# Patient Record
Sex: Female | Born: 1980
Health system: Southern US, Community
[De-identification: ages and names within clinical notes are randomized; demographics above are authoritative.]

## PROBLEM LIST (undated history)

## (undated) ENCOUNTER — Inpatient Hospital Stay (HOSPITAL_COMMUNITY): Payer: Self-pay

## (undated) DIAGNOSIS — K859 Acute pancreatitis without necrosis or infection, unspecified: Secondary | ICD-10-CM

## (undated) DIAGNOSIS — O1492 Unspecified pre-eclampsia, second trimester: Secondary | ICD-10-CM

## (undated) DIAGNOSIS — R002 Palpitations: Secondary | ICD-10-CM

---

## 2011-06-14 ENCOUNTER — Encounter (HOSPITAL_BASED_OUTPATIENT_CLINIC_OR_DEPARTMENT_OTHER): Payer: Self-pay | Admitting: *Deleted

## 2011-06-14 ENCOUNTER — Emergency Department (HOSPITAL_BASED_OUTPATIENT_CLINIC_OR_DEPARTMENT_OTHER)
Admission: EM | Admit: 2011-06-14 | Discharge: 2011-06-14 | Disposition: A | Discharge status: Home / Self Care | Payer: Self-pay | Attending: Emergency Medicine | Admitting: Emergency Medicine

## 2011-06-14 DIAGNOSIS — F17200 Nicotine dependence, unspecified, uncomplicated: Secondary | ICD-10-CM | POA: Insufficient documentation

## 2011-06-14 DIAGNOSIS — J111 Influenza due to unidentified influenza virus with other respiratory manifestations: Secondary | ICD-10-CM | POA: Insufficient documentation

## 2011-06-14 DIAGNOSIS — R0602 Shortness of breath: Secondary | ICD-10-CM | POA: Insufficient documentation

## 2011-06-14 DIAGNOSIS — O98919 Unspecified maternal infectious and parasitic disease complicating pregnancy, unspecified trimester: Secondary | ICD-10-CM

## 2011-06-14 DIAGNOSIS — IMO0002 Reserved for concepts with insufficient information to code with codable children: Secondary | ICD-10-CM | POA: Insufficient documentation

## 2011-06-14 LAB — PREGNANCY, URINE: Preg Test, Ur: POSITIVE

## 2011-06-14 LAB — GLUCOSE, CAPILLARY: Glucose-Capillary: 94 mg/dL (ref 70–99)

## 2011-06-14 MED ORDER — ONDANSETRON HCL 4 MG PO TABS: 4.0000 mg | ORAL_TABLET | Freq: Four times a day (QID) | ORAL | Status: AC

## 2011-06-14 MED ORDER — PRENATAL RX 60-1 MG PO TABS: 1.0000 | ORAL_TABLET | Freq: Every day | ORAL | Status: AC

## 2011-06-14 MED ORDER — OSELTAMIVIR PHOSPHATE 75 MG PO CAPS: 75.0000 mg | ORAL_CAPSULE | Freq: Two times a day (BID) | ORAL | Status: AC

## 2011-06-14 MED ORDER — ONDANSETRON 8 MG PO TBDP
8.0000 mg | ORAL_TABLET | Freq: Once | ORAL | Status: AC
  Administered 2011-06-14: 8 mg via ORAL
  Filled 2011-06-14: qty 1

## 2011-06-14 NOTE — ED Notes (Signed)
Aching all over, chills, fever, headache, nausea, chills, and runny noses since yesterday. Denies cough. States co workers and family members have had the flu.

## 2011-06-14 NOTE — ED Notes (Signed)
accucheck 94

## 2011-06-14 NOTE — ED Provider Notes (Addendum)
History     CSN: [id number]  Arrival date & time 06/14/11  1024   First MD Initiated Contact with Patient 06/14/11 1047      Chief Complaint  Patient presents with  . Fever    (Consider location/radiation/quality/duration/timing/severity/associated sxs/prior treatment) HPI Pt states she has been having coughing and aching.  She has chills but no measured temperature.  Pt also has had runny nose.  Pt was feeling short of breath yesterday occasionally.  Pt has not had any vomiting or diarrhea.    History reviewed. No pertinent past medical history.  History reviewed. No pertinent past surgical history.  No family history on file.  History  Substance Use Topics  . Smoking status: Current Everyday Smoker  . Smokeless tobacco: Not on file  . Alcohol Use: Yes    OB History    Grav Para Term Preterm Abortions TAB SAB Ect Mult Living                  Review of Systems  All other systems reviewed and are negative.    Allergies  Macrobid  Home Medications  No current outpatient prescriptions on file.  BP 116/72  Pulse 71  Temp(Src) 98.6 F (37 C) (Oral)  Resp 22  SpO2 100%  Physical Exam  Nursing note and vitals reviewed. Constitutional: She appears well-developed and well-nourished. No distress.  HENT:  Head: Normocephalic and atraumatic.  Right Ear: External ear normal.  Left Ear: External ear normal.  Eyes: Conjunctivae are normal. Right eye exhibits no discharge. Left eye exhibits no discharge. No scleral icterus.  Neck: Neck supple. No tracheal deviation present.  Cardiovascular: Normal rate, regular rhythm and intact distal pulses.   Pulmonary/Chest: Effort normal and breath sounds normal. No stridor. No respiratory distress. She has no wheezes. She has no rales.  Abdominal: Soft. Bowel sounds are normal. She exhibits no distension. There is no tenderness. There is no rebound and no guarding.  Musculoskeletal: She exhibits no edema and no tenderness.    Neurological: She is alert. She has normal strength. No sensory deficit. Cranial nerve deficit:  no gross defecits noted. She exhibits normal muscle tone. She displays no seizure activity. Coordination normal.  Skin: Skin is warm and dry. No rash noted.  Psychiatric: She has a normal mood and affect.    ED Course  Procedures (including critical care time)   Labs Reviewed  PREGNANCY, URINE  GLUCOSE, CAPILLARY  POCT CBG MONITORING   No results found.   1. Influenza   2. Pregnancy and infectious disease       MDM  Patient's symptoms are suggestive of influenza. She has been exposed to other family members who have the illness. The patient had mentioned that she was late on her last menstrual period and requested a pregnancy test. Patient is pregnant and her last menstrual period was December 14 putting her at about 4 or 5 weeks. Patient is not having abdominal pain or vaginal bleeding. I did encourage her to followup with her OB/GYN doctor. With her pregnancy the CDC does recommend Tamiflu for pregnant patient since there is higher risk for complications. Patient will be prescribed Tamiflu Zofran for nausea and prenatal vitamins.        Celene Kras, MD 06/14/11 [id number]  Celene Kras, MD 06/14/11 [phone]

## 2011-07-01 ENCOUNTER — Emergency Department (INDEPENDENT_AMBULATORY_CARE_PROVIDER_SITE_OTHER): Payer: Self-pay

## 2011-07-01 ENCOUNTER — Encounter (HOSPITAL_BASED_OUTPATIENT_CLINIC_OR_DEPARTMENT_OTHER): Payer: Self-pay | Admitting: *Deleted

## 2011-07-01 ENCOUNTER — Emergency Department (HOSPITAL_BASED_OUTPATIENT_CLINIC_OR_DEPARTMENT_OTHER)
Admission: EM | Admit: 2011-07-01 | Discharge: 2011-07-01 | Disposition: A | Discharge status: Home / Self Care | Payer: Self-pay | Attending: Emergency Medicine | Admitting: Emergency Medicine

## 2011-07-01 DIAGNOSIS — M542 Cervicalgia: Secondary | ICD-10-CM | POA: Insufficient documentation

## 2011-07-01 DIAGNOSIS — IMO0001 Reserved for inherently not codable concepts without codable children: Secondary | ICD-10-CM | POA: Insufficient documentation

## 2011-07-01 DIAGNOSIS — Z331 Pregnant state, incidental: Secondary | ICD-10-CM

## 2011-07-01 DIAGNOSIS — R079 Chest pain, unspecified: Secondary | ICD-10-CM

## 2011-07-01 DIAGNOSIS — O2390 Unspecified genitourinary tract infection in pregnancy, unspecified trimester: Secondary | ICD-10-CM | POA: Insufficient documentation

## 2011-07-01 DIAGNOSIS — O2340 Unspecified infection of urinary tract in pregnancy, unspecified trimester: Secondary | ICD-10-CM

## 2011-07-01 DIAGNOSIS — R51 Headache: Secondary | ICD-10-CM | POA: Insufficient documentation

## 2011-07-01 DIAGNOSIS — N390 Urinary tract infection, site not specified: Secondary | ICD-10-CM | POA: Insufficient documentation

## 2011-07-01 LAB — URINALYSIS, MICROSCOPIC ONLY
Glucose, UA: NEGATIVE mg/dL
Ketones, ur: NEGATIVE mg/dL
Nitrite: NEGATIVE
Protein, ur: NEGATIVE mg/dL
Urobilinogen, UA: 1 mg/dL (ref 0.0–1.0)

## 2011-07-01 MED ORDER — SULFAMETHOXAZOLE-TMP DS 800-160 MG PO TABS: 1.0000 | ORAL_TABLET | Freq: Two times a day (BID) | ORAL | Status: AC

## 2011-07-01 MED ORDER — SULFAMETHOXAZOLE-TMP DS 800-160 MG PO TABS
1.0000 | ORAL_TABLET | Freq: Once | ORAL | Status: AC
  Administered 2011-07-01: 1 via ORAL
  Filled 2011-07-01: qty 1

## 2011-07-01 MED ORDER — ONDANSETRON HCL 4 MG/2ML IJ SOLN
4.0000 mg | Freq: Once | INTRAMUSCULAR | Status: AC
  Administered 2011-07-01: 4 mg via INTRAVENOUS
  Filled 2011-07-01: qty 2

## 2011-07-01 MED ORDER — SODIUM CHLORIDE 0.9 % IV BOLUS (SEPSIS)
1000.0000 mL | Freq: Once | INTRAVENOUS | Status: AC
  Administered 2011-07-01: 1000 mL via INTRAVENOUS

## 2011-07-01 MED ORDER — ACETAMINOPHEN 500 MG PO TABS
1000.0000 mg | ORAL_TABLET | Freq: Once | ORAL | Status: AC
Start: 2011-07-01 — End: 2011-07-01
  Administered 2011-07-01: 1000 mg via ORAL
  Filled 2011-07-01: qty 2

## 2011-07-01 MED ORDER — ALBUTEROL SULFATE (5 MG/ML) 0.5% IN NEBU
5.0000 mg | INHALATION_SOLUTION | Freq: Once | RESPIRATORY_TRACT | Status: AC
  Administered 2011-07-01: 5 mg via RESPIRATORY_TRACT
  Filled 2011-07-01: qty 1

## 2011-07-01 NOTE — ED Notes (Signed)
Pt reports she is 3 months pregnant- verified with EDP Jeraldine Loots prior to transport to [facility]

## 2011-07-01 NOTE — ED Notes (Signed)
Pt describes N/V, generalized body aches, ear and throat pain since yesterday. 3 months pregnant.

## 2011-07-01 NOTE — ED Provider Notes (Signed)
History   This chart was scribed for Gerhard Munch, MD by Melba Coon. The patient was seen in room MH02/MH02 and the patient's care was started at 3:19PM.    CSN: [id number]  Arrival date & time 07/01/11  1452   None     Chief Complaint  Patient presents with  . Generalized Body Aches    (Consider location/radiation/quality/duration/timing/severity/associated sxs/prior treatment) HPI Rebecca Murillo is a 31 y.o. female who presents to the Emergency Department complaining of constant, moderate diffuse headache with an onset this morning with associated generalized body aches. Pt is 3 months pregnant with her 3rd child. Last night,pt had sinus congestion but was otherwise nml. On onset, pt states she felt HA, neck pain, back pain, upper extremity pain, and abd pain. With her HA, pt states that "everything" aggravates the pain and nothing alleviates the pain.  With her neck pain, pt states that moving her neck and head from side to side temporarily alleviates the pain but then pain starts again; she states that it is hard to swallow. With her upper extremity pain, pt states it feels like "needles are poking her arms". Pt also feels dehydrated. Nausea and vomit present, but last time vomited was last night; pt states that her breath makes her nauseous. No diarrhea. No abdominal pain, no vaginal bleeding / discharge / cramping / pain.  No meds taken at home because of pregnancy; pt says she can not take Tylenol. Nml pregnancy so far. No other pertinent medical problems.  OB: Dr. Shawnie Pons   History reviewed. No pertinent past medical history.  History reviewed. No pertinent past surgical history.  History reviewed. No pertinent family history.  History  Substance Use Topics  . Smoking status: Current Everyday Smoker  . Smokeless tobacco: Not on file  . Alcohol Use: Yes    OB History    Grav Para Term Preterm Abortions TAB SAB Ect Mult Living   3 2              Review of  Systems 10 Systems reviewed and are negative for acute change except as noted in the HPI.  Allergies  Macrobid  Home Medications   Current Outpatient Rx  Name Route Sig Dispense Refill  . PRENATAL RX 60-1 MG PO TABS Oral Take 1 tablet by mouth daily. 30 tablet 1    BP 106/66  Pulse 88  Temp(Src) 99.3 F (37.4 C) (Oral)  Resp 18  Ht 5\' 9"  (1.753 m)  Wt 215 lb (97.523 kg)  BMI 31.75 kg/m2  SpO2 100%  Physical Exam  Nursing note and vitals reviewed. Constitutional: She is oriented to person, place, and time. She appears well-developed and well-nourished.  HENT:  Head: Normocephalic and atraumatic.  Right Ear: External ear normal.  Left Ear: External ear normal.  Mouth/Throat: Oropharynx is clear and moist.  Eyes: Conjunctivae and EOM are normal. Pupils are equal, round, and reactive to light. No scleral icterus.  Neck: Normal range of motion. Neck supple. No thyromegaly present.  Cardiovascular: Normal rate, regular rhythm and normal heart sounds.  Exam reveals no gallop and no friction rub.   No murmur heard. Pulmonary/Chest: Effort normal and breath sounds normal. No stridor. She has no wheezes. She has no rales. She exhibits no tenderness.  Abdominal: Soft. Bowel sounds are normal. She exhibits no distension. There is no tenderness. There is no rebound and no guarding.  Musculoskeletal: Normal range of motion. She exhibits no edema.  Lymphadenopathy:    She  has no cervical adenopathy.  Neurological: She is alert and oriented to person, place, and time. Coordination normal.  Skin: Skin is warm. No rash noted. No erythema.  Psychiatric: She has a normal mood and affect. Her behavior is normal.    ED Course  Procedures (including critical care time)  DIAGNOSTIC STUDIES: Oxygen Saturation is 98% on room air, normal by my interpretation.    COORDINATION OF CARE:  3:27PM - EDMD states that he will give pt breathing treatment and IV fluids. Pt agrees with plan of  action.   Labs Reviewed  URINALYSIS, WITH MICROSCOPIC - Abnormal; Notable for the following:    APPearance CLOUDY (*)    Leukocytes, UA SMALL (*)    Bacteria, UA MANY (*)    Squamous Epithelial / LPF MANY (*)    All other components within normal limits  URINALYSIS, ROUTINE W REFLEX MICROSCOPIC  PREGNANCY, URINE   Dg Chest 2 View  07/01/2011  *RADIOLOGY REPORT*  Clinical Data: Chest pain.  3 months pregnant.  The patient was double shielded.  CHEST - 2 VIEW  Comparison: None.  Findings: Cardiomediastinal silhouette is within normal limits. The lungs are clear. No pleural effusion.  No pneumothorax.  No acute osseous abnormality.  IMPRESSION: Normal chest.  Original Report Authenticated By: Harrel Lemon, M.D.   Xr, reviewed  No diagnosis found.    MDM  This G3P2 F p/w one day of generalized discomfort and headache.  On exam she is in no distress though she is uncomfortable.  Following IV fluids, Tylenol patient was much more comfortable.  Patient's labs are notable for a urinary tract infection, and no pneumonia on her radiograph.  The absence of any abdominal complaints or vaginal complaints is reassuring.  The patient was discharged in stable condition to follow up with her obstetrician.  Return precautions were provided, and acknowledged by the patient and her husband.   I personally performed the services described in this documentation, which was scribed in my presence. The recorded information has been reviewed and considered.         Gerhard Munch, MD 07/03/11 [id number]

## 2011-07-01 NOTE — ED Notes (Signed)
Patient sipping ginger ale  

## 2011-11-08 ENCOUNTER — Inpatient Hospital Stay (HOSPITAL_COMMUNITY)
Admission: AD | Admit: 2011-11-08 | Discharge: 2011-11-08 | Disposition: A | Discharge status: Home / Self Care | Payer: Medicaid Other | Source: Ambulatory Visit | Attending: Obstetrics & Gynecology | Admitting: Obstetrics & Gynecology

## 2011-11-08 ENCOUNTER — Encounter (HOSPITAL_COMMUNITY): Payer: Self-pay | Admitting: *Deleted

## 2011-11-08 DIAGNOSIS — M549 Dorsalgia, unspecified: Secondary | ICD-10-CM | POA: Insufficient documentation

## 2011-11-08 DIAGNOSIS — O2390 Unspecified genitourinary tract infection in pregnancy, unspecified trimester: Secondary | ICD-10-CM | POA: Insufficient documentation

## 2011-11-08 DIAGNOSIS — N390 Urinary tract infection, site not specified: Secondary | ICD-10-CM | POA: Insufficient documentation

## 2011-11-08 DIAGNOSIS — O2340 Unspecified infection of urinary tract in pregnancy, unspecified trimester: Secondary | ICD-10-CM

## 2011-11-08 DIAGNOSIS — O212 Late vomiting of pregnancy: Secondary | ICD-10-CM | POA: Insufficient documentation

## 2011-11-08 LAB — CBC
MCH: 30.1 pg (ref 26.0–34.0)
MCHC: 33.2 g/dL (ref 30.0–36.0)
Platelets: 175 10*3/uL (ref 150–400)
RBC: 3.66 MIL/uL — ABNORMAL LOW (ref 3.87–5.11)

## 2011-11-08 LAB — URINE MICROSCOPIC-ADD ON

## 2011-11-08 LAB — URINALYSIS, ROUTINE W REFLEX MICROSCOPIC
Hgb urine dipstick: NEGATIVE
Specific Gravity, Urine: 1.015 (ref 1.005–1.030)
Urobilinogen, UA: 1 mg/dL (ref 0.0–1.0)
pH: 8 (ref 5.0–8.0)

## 2011-11-08 MED ORDER — ONDANSETRON 8 MG PO TBDP: 8.0000 mg | ORAL_TABLET | Freq: Three times a day (TID) | ORAL | Status: AC | PRN

## 2011-11-08 MED ORDER — CEPHALEXIN 250 MG PO CAPS: 250.0000 mg | ORAL_CAPSULE | Freq: Four times a day (QID) | ORAL | Status: AC

## 2011-11-08 MED ORDER — ONDANSETRON 8 MG PO TBDP
8.0000 mg | ORAL_TABLET | Freq: Once | ORAL | Status: AC
  Administered 2011-11-08: 8 mg via ORAL
  Filled 2011-11-08: qty 1

## 2011-11-08 NOTE — MAU Note (Signed)
Pt states she has had a new onset of back pain since last night. States she is dizzy at times. Chills at times. States baby is active. Denies any bleeding or leakage of fluid.

## 2011-11-08 NOTE — MAU Provider Note (Signed)
History     CSN: [id number]  Arrival date and time: 11/08/11 1211   First Provider Initiated Contact with Patient 11/08/11 1242      Chief Complaint  Patient presents with  . Back Pain   HPI Rebecca Murillo 31 y.o. [redacted]w[redacted]d Comes to MAU today with back pain and vomiting.  Similar symptoms when she had another full term birth, so came to MAU for evaluation.  Denies contractions.  Gets prenatal care with Rebecca Murillo in [facility].  OB History    Grav Para Term Preterm Abortions TAB SAB Ect Mult Living   3 2              No past medical history on file.  No past surgical history on file.  No family history on file.  History  Substance Use Topics  . Smoking status: Current Everyday Smoker  . Smokeless tobacco: Not on file  . Alcohol Use: Yes    Allergies:  Allergies  Allergen Reactions  . Nitrofurantoin Monohyd Macro Itching    Prescriptions prior to admission  Medication Sig Dispense Refill  . Prenatal Vit-Fe Fumarate-FA (PRENATAL MULTIVITAMIN) 60-1 MG tablet Take 1 tablet by mouth daily.  30 tablet  1    Review of Systems  Gastrointestinal: Positive for nausea and vomiting.  Genitourinary: Negative for dysuria.  Musculoskeletal: Positive for back pain.   Physical Exam   Blood pressure 120/67, pulse 90, temperature 98.6 F (37 C), temperature source Oral, resp. rate 18.  Physical Exam  Nursing note and vitals reviewed. Constitutional: She is oriented to person, place, and time. She appears well-developed and well-nourished.  HENT:  Head: Normocephalic.  Eyes: EOM are normal.  Neck: Neck supple.  GI: Soft. There is no tenderness.       No contractions palpated and none seen on monitor  Musculoskeletal: Normal range of motion.       Back pain worse on right side but no CVA tenderness.  Neurological: She is alert and oriented to person, place, and time.  Skin: Skin is warm and dry.  Psychiatric: She has a normal mood and affect.    MAU Course    Procedures Zofran 8 mg ODT given   MDM Results for orders placed during the hospital encounter of 11/08/11 (from the past 24 hour(s))  URINALYSIS, ROUTINE W REFLEX MICROSCOPIC     Status: Abnormal   Collection Time   11/08/11 12:30 PM      Component Value Range   Color, Urine YELLOW  YELLOW   APPearance CLOUDY (*) CLEAR   Specific Gravity, Urine 1.015  1.005 - 1.030   pH 8.0  5.0 - 8.0   Glucose, UA NEGATIVE  NEGATIVE mg/dL   Hgb urine dipstick NEGATIVE  NEGATIVE   Bilirubin Urine NEGATIVE  NEGATIVE   Ketones, ur 40 (*) NEGATIVE mg/dL   Protein, ur NEGATIVE  NEGATIVE mg/dL   Urobilinogen, UA 1.0  0.0 - 1.0 mg/dL   Nitrite POSITIVE (*) NEGATIVE   Leukocytes, UA SMALL (*) NEGATIVE  URINE MICROSCOPIC-ADD ON     Status: Abnormal   Collection Time   11/08/11 12:30 PM      Component Value Range   Squamous Epithelial / LPF MANY (*) RARE   WBC, UA 7-10  <3 WBC/hpf   Bacteria, UA MANY (*) RARE  CBC     Status: Abnormal   Collection Time   11/08/11  1:16 PM      Component Value Range   WBC 11.8 (*)  4.0 - 10.5 K/uL   RBC 3.66 (*) 3.87 - 5.11 MIL/uL   Hemoglobin 11.0 (*) 12.0 - 15.0 g/dL   HCT 16.1 (*) 09.6 - 04.5 %   MCV 90.4  78.0 - 100.0 fL   MCH 30.1  26.0 - 34.0 pg   MCHC 33.2  30.0 - 36.0 g/dL   RDW 40.9  81.1 - 91.4 %   Platelets 175  150 - 400 K/uL    Assessment and Plan  UTI in pregnancy  Plan rx Keflex 500 gm PO QID x 7 days (#28) no refills Call your doctor and be seen in the office if you have fever, worse pain or continued vomiting.  Rebecca Murillo 11/08/2011, 1:04 PM

## 2011-11-08 NOTE — MAU Note (Signed)
EMS arrival: cold, chills- sick, "I am never cold, knew something was wrong"

## 2011-11-08 NOTE — Discharge Instructions (Signed)
Drink at least 8 8-oz glasses of water every day. Get your prescriptions filled and take all as directed. Call your doctor and be seen in the office if you have fever, worse pain or continued vomiting.

## 2011-12-21 ENCOUNTER — Emergency Department (HOSPITAL_BASED_OUTPATIENT_CLINIC_OR_DEPARTMENT_OTHER)
Admission: EM | Admit: 2011-12-21 | Discharge: 2011-12-21 | Disposition: A | Discharge status: Home / Self Care | Payer: Medicaid Other | Attending: Emergency Medicine | Admitting: Emergency Medicine

## 2011-12-21 ENCOUNTER — Encounter (HOSPITAL_BASED_OUTPATIENT_CLINIC_OR_DEPARTMENT_OTHER): Payer: Self-pay

## 2011-12-21 ENCOUNTER — Emergency Department (HOSPITAL_BASED_OUTPATIENT_CLINIC_OR_DEPARTMENT_OTHER): Payer: Medicaid Other

## 2011-12-21 DIAGNOSIS — M79675 Pain in left toe(s): Secondary | ICD-10-CM

## 2011-12-21 DIAGNOSIS — M79609 Pain in unspecified limb: Secondary | ICD-10-CM | POA: Insufficient documentation

## 2011-12-21 NOTE — ED Notes (Signed)
C/o pain to left great toe x 1 week-denies injury

## 2011-12-21 NOTE — ED Provider Notes (Signed)
History     CSN: [id number]  Arrival date & time 12/21/11  1637   First MD Initiated Contact with Patient 12/21/11 1650      Chief Complaint  Patient presents with  . Toe Pain    (Consider location/radiation/quality/duration/timing/severity/associated sxs/prior treatment) HPI Pt presents with pain in left great toe- she states symptoms have been present for over one week.  She is in 3rd trimester of pregnancy.  She states nail has looked more thick.  No fever, no known trauma to foot.  She took tylenol for discomfort and states that it did help.  Palpation makes pain worse. Described as soreness There are no other associated systemic symptoms, there are no other alleviating or modifying factors.   History reviewed. No pertinent past medical history.  Past Surgical History  Procedure Date  . Cesarean section     No family history on file.  History  Substance Use Topics  . Smoking status: Current Everyday Smoker  . Smokeless tobacco: Not on file  . Alcohol Use: Yes    OB History    Grav Para Term Preterm Abortions TAB SAB Ect Mult Living   3 2              Review of Systems ROS reviewed and all otherwise negative except for mentioned in HPI  Allergies  Nitrofurantoin monohyd macro  Home Medications   Current Outpatient Rx  Name Route Sig Dispense Refill  . PRENATAL RX 60-1 MG PO TABS Oral Take 1 tablet by mouth daily. 30 tablet 1    BP 113/66  Pulse 90  Temp 98.7 F (37.1 C) (Oral)  Resp 18  Ht 5\' 9"  (1.753 m)  Wt 215 lb (97.523 kg)  BMI 31.75 kg/m2  SpO2 100% Vitals reviewed Physical Exam Physical Examination: General appearance - alert, well appearing, and in no distress Mental status - alert, oriented to person, place, and time Eyes - pupils equal and reactive, extraocular eye movements intact Abdomen - soft, nontender, gravid Musculoskeletal - no joint tenderness, deformity or swelling Extremities - peripheral pulses normal, no pedal edema, no  clubbing or cyanosis, left great toenail thickened and lifted off nailbed, no surrounding signs of infection/felon/paronychia, no bruising or subungual hematoma Skin - normal coloration and turgor, no rashes  ED Course  Procedures (including critical care time)  Labs Reviewed - No data to display No results found.   1. Pain in toe of left foot       MDM  Pt with pain in left great toe with associated nail thickening- there is no evidence of paronychia. 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  Xray reassuring.  Discussed with patient that topical treatments may not be successful and nail infections needed po therapy which I would not advise during her pregnancy.  She was told by her doctor that she would need to see a podiatrist, I agree and have given her information for podiatry follow up.  Discharged with strict return precautions.  Pt agreeable with plan.        Ethelda Chick, MD 12/25/11 [phone]

## 2012-01-18 ENCOUNTER — Telehealth (HOSPITAL_COMMUNITY): Payer: Self-pay | Admitting: *Deleted

## 2012-01-18 NOTE — Telephone Encounter (Signed)
Preadmission screen  

## 2012-09-17 ENCOUNTER — Encounter (HOSPITAL_BASED_OUTPATIENT_CLINIC_OR_DEPARTMENT_OTHER): Payer: Self-pay

## 2012-09-17 ENCOUNTER — Emergency Department (HOSPITAL_BASED_OUTPATIENT_CLINIC_OR_DEPARTMENT_OTHER)
Admission: EM | Admit: 2012-09-17 | Discharge: 2012-09-17 | Disposition: A | Discharge status: Home / Self Care | Payer: Self-pay | Attending: Emergency Medicine | Admitting: Emergency Medicine

## 2012-09-17 ENCOUNTER — Emergency Department (HOSPITAL_BASED_OUTPATIENT_CLINIC_OR_DEPARTMENT_OTHER): Payer: Self-pay

## 2012-09-17 DIAGNOSIS — F17200 Nicotine dependence, unspecified, uncomplicated: Secondary | ICD-10-CM | POA: Insufficient documentation

## 2012-09-17 DIAGNOSIS — Y929 Unspecified place or not applicable: Secondary | ICD-10-CM | POA: Insufficient documentation

## 2012-09-17 DIAGNOSIS — S5000XA Contusion of unspecified elbow, initial encounter: Secondary | ICD-10-CM | POA: Insufficient documentation

## 2012-09-17 DIAGNOSIS — S5001XA Contusion of right elbow, initial encounter: Secondary | ICD-10-CM

## 2012-09-17 DIAGNOSIS — R296 Repeated falls: Secondary | ICD-10-CM | POA: Insufficient documentation

## 2012-09-17 DIAGNOSIS — Y939 Activity, unspecified: Secondary | ICD-10-CM | POA: Insufficient documentation

## 2012-09-17 MED ORDER — IBUPROFEN 400 MG PO TABS
600.0000 mg | ORAL_TABLET | Freq: Once | ORAL | Status: AC
  Administered 2012-09-17: 600 mg via ORAL
  Filled 2012-09-17: qty 1

## 2012-09-17 NOTE — ED Notes (Signed)
Pt states that she fell this morning injuring her R elbow.  No obvious deformity.  PMS intact, minor localized swelling noted.

## 2012-09-24 NOTE — ED Provider Notes (Addendum)
History    32 year old female with right elbow pain. Onset June before arrival. Patient had mechanical fall onto her right side. Persistent pain since then. She denies any significant pain anywhere else. No numbness, tingling or loss of strength. No intervention prior to arrival. CSN: [id number]  Arrival date & time 09/17/12  1222   First MD Initiated Contact with Patient 09/17/12 1231      Chief Complaint  Patient presents with  . Arm Injury    (Consider location/radiation/quality/duration/timing/severity/associated sxs/prior treatment) HPI  History reviewed. No pertinent past medical history.  Past Surgical History  Procedure Laterality Date  . Cesarean section      History reviewed. No pertinent family history.  History  Substance Use Topics  . Smoking status: Current Every Day Smoker -- 1.00 packs/day    Types: Cigarettes  . Smokeless tobacco: Not on file  . Alcohol Use: Yes    OB History   Grav Para Term Preterm Abortions TAB SAB Ect Mult Living   3 2              Review of Systems  All systems reviewed and negative, other than as noted in HPI.   Allergies  Nitrofurantoin monohyd macro  Home Medications  No current outpatient prescriptions on file.  BP 140/77  Pulse 81  Temp(Src) 98.3 F (36.8 C) (Oral)  Resp 20  Ht 5\' 9"  (1.753 m)  Wt 208 lb (94.348 kg)  BMI 30.7 kg/m2  SpO2 100%  LMP 09/12/2012  Breastfeeding? Unknown  Physical Exam  Nursing note and vitals reviewed. Constitutional: She appears well-developed and well-nourished. No distress.  HENT:  Head: Normocephalic and atraumatic.  Eyes: Conjunctivae are normal. Right eye exhibits no discharge. Left eye exhibits no discharge.  Neck: Neck supple.  Cardiovascular: Normal rate, regular rhythm and normal heart sounds.  Exam reveals no gallop and no friction rub.   No murmur heard. Pulmonary/Chest: Effort normal and breath sounds normal. No respiratory distress.  Abdominal: Soft. She  exhibits no distension. There is no tenderness.  Musculoskeletal: She exhibits tenderness. She exhibits no edema.  Mild tenderness of the right elbow the olecranon. Mild increase in pain with range of motion. No swelling. No concerning skin lesions noted. Neurovascularly intact distally.  Neurological: She is alert.  Skin: Skin is warm and dry. She is not diaphoretic.  Psychiatric: She has a normal mood and affect. Her behavior is normal. Thought content normal.    ED Course  Procedures (including critical care time)  Labs Reviewed - No data to display No results found.   1. Elbow contusion, right, initial encounter       MDM  32 year old female with R elbow pain. Likely contusion. Imaging negative for acute osseous injury. Injury is closed. NVI intact. Return precautions discussed.       Raeford Razor, MD 09/24/12 [id number]  Raeford Razor, MD 09/24/12 [phone]

## 2013-02-25 ENCOUNTER — Emergency Department (HOSPITAL_BASED_OUTPATIENT_CLINIC_OR_DEPARTMENT_OTHER)
Admission: EM | Admit: 2013-02-25 | Discharge: 2013-02-25 | Disposition: A | Discharge status: Home / Self Care | Payer: Medicaid Other | Attending: Emergency Medicine | Admitting: Emergency Medicine

## 2013-02-25 ENCOUNTER — Encounter (HOSPITAL_BASED_OUTPATIENT_CLINIC_OR_DEPARTMENT_OTHER): Payer: Self-pay | Admitting: Emergency Medicine

## 2013-02-25 ENCOUNTER — Emergency Department (HOSPITAL_BASED_OUTPATIENT_CLINIC_OR_DEPARTMENT_OTHER): Payer: Medicaid Other

## 2013-02-25 DIAGNOSIS — F17200 Nicotine dependence, unspecified, uncomplicated: Secondary | ICD-10-CM | POA: Insufficient documentation

## 2013-02-25 DIAGNOSIS — R079 Chest pain, unspecified: Secondary | ICD-10-CM

## 2013-02-25 DIAGNOSIS — Z79899 Other long term (current) drug therapy: Secondary | ICD-10-CM | POA: Insufficient documentation

## 2013-02-25 DIAGNOSIS — R072 Precordial pain: Secondary | ICD-10-CM | POA: Insufficient documentation

## 2013-02-25 HISTORY — DX: Unspecified pre-eclampsia, second trimester: O14.92

## 2013-02-25 LAB — COMPREHENSIVE METABOLIC PANEL
ALT: 20 U/L (ref 0–35)
AST: 18 U/L (ref 0–37)
Alkaline Phosphatase: 53 U/L (ref 39–117)
BUN: 15 mg/dL (ref 6–23)
CO2: 23 mEq/L (ref 19–32)
GFR calc Af Amer: >90 mL/min (ref >90)
GFR calc non Af Amer: 84 mL/min — ABNORMAL LOW (ref >90)
Glucose, Bld: 95 mg/dL (ref 70–99)
Potassium: 3.4 mEq/L — ABNORMAL LOW (ref 3.5–5.1)
Sodium: 136 mEq/L (ref 135–145)

## 2013-02-25 LAB — CBC WITH DIFFERENTIAL/PLATELET
Basophils Absolute: 0 10*3/uL (ref 0.0–0.1)
Eosinophils Relative: 1 % (ref 0–5)
Lymphocytes Relative: 35 % (ref 12–46)
Lymphs Abs: 2.9 10*3/uL (ref 0.7–4.0)
MCV: 89.2 fL (ref 78.0–100.0)
Monocytes Relative: 11 % (ref 3–12)
Neutro Abs: 4.5 10*3/uL (ref 1.7–7.7)
Neutrophils Relative %: 54 % (ref 43–77)
Platelets: 181 10*3/uL (ref 150–400)
RBC: 4.35 MIL/uL (ref 3.87–5.11)
RDW: 14 % (ref 11.5–15.5)
WBC: 8.3 10*3/uL (ref 4.0–10.5)

## 2013-02-25 NOTE — ED Notes (Signed)
Pt reports intermittant episodes of chest pain that she tx with antacid thinking it was indigestion with out relief deceased as pressure and sharp pain that occurs with exertion

## 2013-02-25 NOTE — ED Provider Notes (Signed)
CSN: [id number]     Arrival date & time 02/25/13  0224 History   First MD Initiated Contact with Patient 02/25/13 0242     Chief Complaint  Patient presents with  . Chest Pain   (Consider location/radiation/quality/duration/timing/severity/associated sxs/prior Treatment) HPI Comments: Patient is an otherwise healthy 32 year old female presents to the emergency department with complaints of intermittent chest pains off and on for the past 3 days. She states that this feels like a heaviness and comes and goes at random. It can occur with both exertion and rest. Says she feels short of breath when the symptoms are there but denies radiation to the arm or jaw. She denies nausea or diaphoresis. Tells me she had a stress test a few years ago when she was pregnant with her daughter which was unremarkable. Her risk factors are cigarette smoking she does have a family history of heart disease.  Patient is a 32 y.o. female presenting with chest pain. The history is provided by the patient.  Chest Pain Pain location:  Substernal area Pain quality comment:  Heaviness Pain radiates to:  Does not radiate Pain radiates to the back: no   Pain severity:  Moderate Onset quality:  Sudden Duration:  3 days Timing:  Intermittent Progression:  Worsening Chronicity:  New Context: not breathing   Relieved by:  Nothing Worsened by:  Nothing tried Ineffective treatments:  None tried Associated symptoms: no abdominal pain     Past Medical History  Diagnosis Date  . Pre-eclampsia in second trimester    Past Surgical History  Procedure Laterality Date  . Cesarean section     History reviewed. No pertinent family history. History  Substance Use Topics  . Smoking status: Current Every Day Smoker -- 1.00 packs/day    Types: Cigarettes  . Smokeless tobacco: Not on file  . Alcohol Use: Yes   OB History   Grav Para Term Preterm Abortions TAB SAB Ect Mult Living   3 2             Review of Systems   Cardiovascular: Positive for chest pain.  Gastrointestinal: Negative for abdominal pain.  All other systems reviewed and are negative.    Allergies  Nitrofurantoin monohyd macro  Home Medications   Current Outpatient Rx  Name  Route  Sig  Dispense  Refill  . Multiple Vitamin (MULTIVITAMIN) capsule   Oral   Take 1 capsule by mouth daily.          BP 115/81  Pulse 83  Temp(Src) 98.4 F (36.9 C) (Oral)  Resp 20  Ht 5\' 9"  (1.753 m)  Wt 220 lb (99.791 kg)  BMI 32.47 kg/m2  SpO2 99%  LMP 01/26/2013 Physical Exam  Nursing note and vitals reviewed. Constitutional: She is oriented to person, place, and time. She appears well-developed and well-nourished. No distress.  HENT:  Head: Normocephalic and atraumatic.  Neck: Normal range of motion. Neck supple.  Cardiovascular: Normal rate and regular rhythm.  Exam reveals no gallop and no friction rub.   No murmur heard. Pulmonary/Chest: Effort normal and breath sounds normal. No respiratory distress. She has no wheezes.  There is tenderness to palpation of the anterior chest wall which seems to reproduce her symptoms  Abdominal: Soft. Bowel sounds are normal. She exhibits no distension. There is no tenderness.  Musculoskeletal: Normal range of motion. She exhibits no edema.  Neurological: She is alert and oriented to person, place, and time.  Skin: Skin is warm and dry. She is  not diaphoretic.    ED Course  Procedures (including critical care time) Labs Review Labs Reviewed  CBC WITH DIFFERENTIAL  COMPREHENSIVE METABOLIC PANEL  TROPONIN I   Imaging Review No results found.   Date: 02/25/2013  Rate: 79  Rhythm: normal sinus rhythm with pvc's  QRS Axis: normal  Intervals: normal  ST/T Wave abnormalities: normal  Conduction Disutrbances:none  Narrative Interpretation:   Old EKG Reviewed: none available    MDM  No diagnosis found. Patient is a 32 year old female with only cardiac risk factor of cigarette use. She  presents with sharp pain in her chest this occurring in intermittently for the past 3 days it is unrelated to exertion. Chest wall is tender with palpation in this seems to reproduce her symptoms. Workup today was unremarkable including EKG and troponin. She tells me she had a stress test in the past several years which was unremarkable. I do not have a record of this visit was performed at an outside facility but the patient seems pretty certain this was the case. I do not believe this to be a cardiac issue and I strongly doubt pulmonary embolism. Chest x-ray does not reveal any evidence for pneumonia or pneumothorax. She will be discharged with anti-inflammatories, rest, and time. She is to return if her symptoms worsen or change.    Geoffery Lyons, MD 02/25/13 [phone]

## 2013-05-24 ENCOUNTER — Encounter (HOSPITAL_BASED_OUTPATIENT_CLINIC_OR_DEPARTMENT_OTHER): Payer: Self-pay | Admitting: Emergency Medicine

## 2013-05-24 ENCOUNTER — Emergency Department (HOSPITAL_BASED_OUTPATIENT_CLINIC_OR_DEPARTMENT_OTHER)
Admission: EM | Admit: 2013-05-24 | Discharge: 2013-05-25 | Disposition: A | Discharge status: Home / Self Care | Payer: Medicaid Other | Attending: Emergency Medicine | Admitting: Emergency Medicine

## 2013-05-24 ENCOUNTER — Emergency Department (HOSPITAL_BASED_OUTPATIENT_CLINIC_OR_DEPARTMENT_OTHER): Payer: Medicaid Other

## 2013-05-24 DIAGNOSIS — J40 Bronchitis, not specified as acute or chronic: Secondary | ICD-10-CM

## 2013-05-24 DIAGNOSIS — F17200 Nicotine dependence, unspecified, uncomplicated: Secondary | ICD-10-CM | POA: Insufficient documentation

## 2013-05-24 DIAGNOSIS — R000 Tachycardia, unspecified: Secondary | ICD-10-CM | POA: Insufficient documentation

## 2013-05-24 DIAGNOSIS — J029 Acute pharyngitis, unspecified: Secondary | ICD-10-CM | POA: Insufficient documentation

## 2013-05-24 DIAGNOSIS — R509 Fever, unspecified: Secondary | ICD-10-CM | POA: Insufficient documentation

## 2013-05-24 DIAGNOSIS — E669 Obesity, unspecified: Secondary | ICD-10-CM | POA: Insufficient documentation

## 2013-05-24 DIAGNOSIS — J209 Acute bronchitis, unspecified: Secondary | ICD-10-CM | POA: Insufficient documentation

## 2013-05-24 DIAGNOSIS — R51 Headache: Secondary | ICD-10-CM | POA: Insufficient documentation

## 2013-05-24 DIAGNOSIS — H9209 Otalgia, unspecified ear: Secondary | ICD-10-CM | POA: Insufficient documentation

## 2013-05-24 DIAGNOSIS — IMO0001 Reserved for inherently not codable concepts without codable children: Secondary | ICD-10-CM | POA: Insufficient documentation

## 2013-05-24 LAB — CBC WITH DIFFERENTIAL/PLATELET
Basophils Absolute: 0 10*3/uL (ref 0.0–0.1)
Basophils Relative: 0 % (ref 0–1)
Eosinophils Absolute: 0 10*3/uL (ref 0.0–0.7)
Eosinophils Relative: 0 % (ref 0–5)
HCT: 38.5 % (ref 36.0–46.0)
Hemoglobin: 13 g/dL (ref 12.0–15.0)
Lymphocytes Relative: 17 % (ref 12–46)
Lymphs Abs: 1.8 K/uL (ref 0.7–4.0)
MCH: 30.8 pg (ref 26.0–34.0)
MCHC: 33.8 g/dL (ref 30.0–36.0)
MCV: 91.2 fL (ref 78.0–100.0)
Monocytes Absolute: 1.2 10*3/uL — ABNORMAL HIGH (ref 0.1–1.0)
Monocytes Relative: 12 % (ref 3–12)
Neutro Abs: 7.5 10*3/uL (ref 1.7–7.7)
Neutrophils Relative %: 71 % (ref 43–77)
Platelets: 183 K/uL (ref 150–400)
RBC: 4.22 MIL/uL (ref 3.87–5.11)
RDW: 13.5 % (ref 11.5–15.5)
WBC: 10.6 10*3/uL — ABNORMAL HIGH (ref 4.0–10.5)

## 2013-05-24 NOTE — ED Notes (Signed)
Multiple complaints: productive cough x 4 days, right otalgia, sore throat, multiple small pimple size blister at tip of tongue, fatigue, chills, lightheadedness at times.   Pt A&Ox4, lungs clear, mucus membrane moist, tympanic membrane appear normal. Pt on monitor because she was tachy at 124, HR now 98

## 2013-05-24 NOTE — ED Notes (Signed)
Onset of cough, congestion, flu like symptoms four days ago.  C/o excessive rhinorrhea.  Also requesting evaluation for 'bumps' on her tongue.

## 2013-05-24 NOTE — ED Provider Notes (Signed)
Medical screening examination/treatment/procedure(s) were conducted as a shared visit with non-physician practitioner(s) and myself.  I personally evaluated the patient during the encounter.  EKG Interpretation    Date/Time:  Saturday May 24 2013 23:29:37 EST Ventricular Rate:  121 PR Interval:  160 QRS Duration: 90 QT Interval:  270 QTC Calculation: 383 R Axis:   -9 Text Interpretation:  Ectopic atrial tachycardia , new since ECG on 02/25/13 Nonspecific T wave abnormality Abnormal ECG Confirmed by [facility]  MD, MICHEAL ([id number]) on 05/25/2013 12:08:20 AM            Pt with cough and congestion, cold symptoms, has been taking Robitussin cough and cold as well as a decongestant, none today, while in the ED, with coughing, her heart rhythm changes morphology and rate from sinus with freq PAC's, to a sustained narrow complex atrial tachycardia at a rate of 127.  Pt is asymptomatic with the change, likely due to the OTC meds, but will get ECG, check electrolytes here and treat accordingly.  Pt is young, otherwise healthy and she denies feeling faint with these changes, no worsening CP, with symptoms.      12:08 AM  Reviewed ECG which shows ectopic atrial tachycardia, intermittent, will change to sinus rhythm with freq PAC's.  Pt has no symptoms between each.  No hemodynamic instability.  Will discuss with cardiology fellow to look at ECG and help ensure appropriate disposition and/or close follow up.    Gavin Pound. Oletta Lamas, MD 05/25/13 1191

## 2013-05-24 NOTE — ED Provider Notes (Signed)
CSN: [id number]     Arrival date & time 05/24/13  1919 History   First MD Initiated Contact with Patient 05/24/13 2238     Chief Complaint  Patient presents with  . Cough   (Consider location/radiation/quality/duration/timing/severity/associated sxs/prior Treatment) Patient is a 32 y.o. female presenting with cough. The history is provided by the patient.  Cough Cough characteristics:  Productive Sputum characteristics:  Yellow and green Severity:  Moderate Onset quality:  Gradual Duration:  4 days Timing:  Constant Progression:  Worsening Chronicity:  New Smoker: yes   Relieved by:  Nothing Worsened by:  Smoking, lying down and activity Ineffective treatments:  Decongestant and cough suppressants Associated symptoms: chills, ear pain, fever, headaches, myalgias, rhinorrhea, shortness of breath, sinus congestion and sore throat   Associated symptoms: no eye discharge, no rash and no wheezing    Rebecca Murillo is a 32 y.o. female who presents to the ED with cough cold and congestion x 4 days. She smokes cigarettes daily. She feels short of breath with coughing spells.   Past Medical History  Diagnosis Date  . Pre-eclampsia in second trimester    Past Surgical History  Procedure Laterality Date  . Cesarean section     No family history on file. History  Substance Use Topics  . Smoking status: Current Every Day Smoker -- 1.00 packs/day    Types: Cigarettes  . Smokeless tobacco: Not on file  . Alcohol Use: Yes   OB History   Grav Para Term Preterm Abortions TAB SAB Ect Mult Living   3 2             Review of Systems  Constitutional: Positive for fever and chills.  HENT: Positive for ear pain, rhinorrhea and sore throat.   Eyes: Negative for discharge and redness.  Respiratory: Positive for cough and shortness of breath. Negative for wheezing.   Genitourinary: Negative for dysuria and urgency.  Musculoskeletal: Positive for myalgias.  Skin: Negative for rash.   Allergic/Immunologic: Negative for immunocompromised state.  Neurological: Positive for headaches.  Psychiatric/Behavioral: Negative for confusion. The patient is not nervous/anxious.     Allergies  Nitrofurantoin monohyd macro  Home Medications   Current Outpatient Rx  Name  Route  Sig  Dispense  Refill  . Multiple Vitamin (MULTIVITAMIN) capsule   Oral   Take 1 capsule by mouth daily.          BP 125/80  Pulse 117  Temp(Src) 99.4 F (37.4 C) (Oral)  Resp 24  Ht 5\' 9"  (1.753 m)  Wt 230 lb (104.327 kg)  BMI 33.95 kg/m2  SpO2 99%  LMP 05/24/2013 Physical Exam  Nursing note and vitals reviewed. Constitutional: She is oriented to person, place, and time. No distress.  Obese   Eyes: EOM are normal.  Neck: Neck supple.  Cardiovascular: Tachycardia present.   Pulmonary/Chest: Effort normal. She has no wheezes. She has rhonchi.  Abdominal: Soft. There is no tenderness.  Musculoskeletal: Normal range of motion.  Neurological: She is alert and oriented to person, place, and time. No cranial nerve deficit.  Skin: Skin is warm and dry.  Psychiatric: She has a normal mood and affect. Her behavior is normal.    Results for orders placed during the hospital encounter of 05/24/13 (from the past 24 hour(s))  BASIC METABOLIC PANEL     Status: Abnormal   Collection Time    05/24/13 11:48 PM      Result Value Range   Sodium 136  135 -  145 mEq/L   Potassium 3.4 (*) 3.5 - 5.1 mEq/L   Chloride 101  96 - 112 mEq/L   CO2 20  19 - 32 mEq/L   Glucose, Bld 100 (*) 70 - 99 mg/dL   BUN 10  6 - 23 mg/dL   Creatinine, Ser 9.14  0.50 - 1.10 mg/dL   Calcium 8.8  8.4 - 78.2 mg/dL   GFR calc non Af Amer >90  >90 mL/min   GFR calc Af Amer >90  >90 mL/min  CBC WITH DIFFERENTIAL     Status: Abnormal   Collection Time    05/24/13 11:48 PM      Result Value Range   WBC 10.6 (*) 4.0 - 10.5 K/uL   RBC 4.22  3.87 - 5.11 MIL/uL   Hemoglobin 13.0  12.0 - 15.0 g/dL   HCT 95.6  21.3 - 08.6 %    MCV 91.2  78.0 - 100.0 fL   MCH 30.8  26.0 - 34.0 pg   MCHC 33.8  30.0 - 36.0 g/dL   RDW 57.8  46.9 - 62.9 %   Platelets 183  150 - 400 K/uL   Neutrophils Relative % 71  43 - 77 %   Neutro Abs 7.5  1.7 - 7.7 K/uL   Lymphocytes Relative 17  12 - 46 %   Lymphs Abs 1.8  0.7 - 4.0 K/uL   Monocytes Relative 12  3 - 12 %   Monocytes Absolute 1.2 (*) 0.1 - 1.0 K/uL   Eosinophils Relative 0  0 - 5 %   Eosinophils Absolute 0.0  0.0 - 0.7 K/uL   Basophils Relative 0  0 - 1 %   Basophils Absolute 0.0  0.0 - 0.1 K/uL    Date/Time:  Saturday May 24 2013 23:29:37 EST Ventricular Rate:  121 PR Interval:  160 QRS Duration: 90 QT Interval:  270 QTC Calculation: 383 R Axis:   -9 Text Interpretation:  Ectopic atrial tachycardia , new since ECG on 02/25/13 Nonspecific T wave abnormality Abnormal ECG Confirmed by [facility]  MD, MICHEAL ([id number]) on 05/25/2013 12:08:20 AM       ED Course: Dr. Oletta Lamas in to examine the patient.   Procedures  Imaging Review Dg Chest 2 View  05/24/2013   CLINICAL DATA:  Cough, congestion  EXAM: CHEST  2 VIEW  COMPARISON:  02/25/2013  FINDINGS: The heart size and mediastinal contours are within normal limits. Both lungs are clear. The visualized skeletal structures are unremarkable.  IMPRESSION: No active cardiopulmonary disease.   Electronically Signed   By: Ruel Favors M.D.   On: 05/24/2013 21:50    MDM: Consult with Cardiology, he does not feel that the patient's EKG indicates need for admission or any further work up tonight.   32 y.o. female with cough, cold and congestion. Dr. Oletta Lamas in to examine the patient and discuss his concerns for her tachycardia and abnormal EKG. I have reviewed this patient's vital signs, nurses notes, appropriate labs and imaging.  I have discussed findings with the patient and plan of care. She voices understanding. She will stop all her OTC medications and only take the prescribed medication. I discussed in detail with the patient need for  follow up with her PCP and repeat EKG. Discussed need to stop smoking. Patient stable for discharge without any immediate complications. Heart rate down to 98 prior to discharge and patient taking PO fluids without difficulty.    Medication List    TAKE these medications  azithromycin 250 MG tablet  Commonly known as:  ZITHROMAX Z-PAK  Take 2 tables now and then one tablet PO daily until finished     guaiFENesin-codeine 100-10 MG/5ML syrup  Commonly known as:  ROBITUSSIN AC  Take 5 mLs by mouth 3 (three) times daily as needed for cough.      ASK your doctor about these medications       multivitamin capsule  Take 1 capsule by mouth daily.           [address] [place], [place] 05/29/13 [phone]

## 2013-05-25 LAB — BASIC METABOLIC PANEL
BUN: 10 mg/dL (ref 6–23)
Chloride: 101 mEq/L (ref 96–112)
Creatinine, Ser: 0.8 mg/dL (ref 0.50–1.10)
GFR calc Af Amer: >90 mL/min (ref >90)
GFR calc non Af Amer: >90 mL/min (ref >90)
Glucose, Bld: 100 mg/dL — ABNORMAL HIGH (ref 70–99)
Potassium: 3.4 mEq/L — ABNORMAL LOW (ref 3.5–5.1)

## 2013-05-25 MED ORDER — AZITHROMYCIN 250 MG PO TABS: ORAL_TABLET | ORAL | Status: DC

## 2013-05-25 MED ORDER — GUAIFENESIN-CODEINE 100-10 MG/5ML PO SYRP: 5.0000 mL | ORAL_SOLUTION | Freq: Three times a day (TID) | ORAL | Status: DC | PRN

## 2013-06-17 ENCOUNTER — Emergency Department (HOSPITAL_COMMUNITY)
Admission: EM | Admit: 2013-06-17 | Discharge: 2013-06-17 | Disposition: A | Discharge status: Home / Self Care | Payer: Medicaid Other | Attending: Emergency Medicine | Admitting: Emergency Medicine

## 2013-06-17 ENCOUNTER — Encounter (HOSPITAL_COMMUNITY): Payer: Self-pay | Admitting: Emergency Medicine

## 2013-06-17 ENCOUNTER — Emergency Department (HOSPITAL_COMMUNITY): Payer: Medicaid Other

## 2013-06-17 DIAGNOSIS — B349 Viral infection, unspecified: Secondary | ICD-10-CM

## 2013-06-17 DIAGNOSIS — R05 Cough: Secondary | ICD-10-CM | POA: Insufficient documentation

## 2013-06-17 DIAGNOSIS — R197 Diarrhea, unspecified: Secondary | ICD-10-CM | POA: Insufficient documentation

## 2013-06-17 DIAGNOSIS — O98819 Other maternal infectious and parasitic diseases complicating pregnancy, unspecified trimester: Secondary | ICD-10-CM | POA: Insufficient documentation

## 2013-06-17 DIAGNOSIS — O99330 Smoking (tobacco) complicating pregnancy, unspecified trimester: Secondary | ICD-10-CM | POA: Insufficient documentation

## 2013-06-17 DIAGNOSIS — R52 Pain, unspecified: Secondary | ICD-10-CM | POA: Insufficient documentation

## 2013-06-17 DIAGNOSIS — B9789 Other viral agents as the cause of diseases classified elsewhere: Secondary | ICD-10-CM | POA: Insufficient documentation

## 2013-06-17 DIAGNOSIS — R509 Fever, unspecified: Secondary | ICD-10-CM | POA: Insufficient documentation

## 2013-06-17 DIAGNOSIS — O9989 Other specified diseases and conditions complicating pregnancy, childbirth and the puerperium: Secondary | ICD-10-CM | POA: Insufficient documentation

## 2013-06-17 DIAGNOSIS — O219 Vomiting of pregnancy, unspecified: Secondary | ICD-10-CM | POA: Insufficient documentation

## 2013-06-17 DIAGNOSIS — R059 Cough, unspecified: Secondary | ICD-10-CM | POA: Insufficient documentation

## 2013-06-17 LAB — POCT I-STAT, CHEM 8
BUN: 6 mg/dL (ref 6–23)
CALCIUM ION: 1.16 mmol/L (ref 1.12–1.23)
CHLORIDE: 105 meq/L (ref 96–112)
Creatinine, Ser: 0.8 mg/dL (ref 0.50–1.10)
Glucose, Bld: 93 mg/dL (ref 70–99)
HCT: 39 % (ref 36.0–46.0)
HEMOGLOBIN: 13.3 g/dL (ref 12.0–15.0)
Potassium: 3.6 mEq/L — ABNORMAL LOW (ref 3.7–5.3)
Sodium: 140 mEq/L (ref 137–147)
TCO2: 22 mmol/L (ref 0–100)

## 2013-06-17 LAB — URINALYSIS, ROUTINE W REFLEX MICROSCOPIC
Bilirubin Urine: NEGATIVE
Glucose, UA: NEGATIVE mg/dL
Ketones, ur: NEGATIVE mg/dL
Nitrite: POSITIVE — AB
Protein, ur: NEGATIVE mg/dL
SPECIFIC GRAVITY, URINE: 1.026 (ref 1.005–1.030)
Urobilinogen, UA: 1 mg/dL (ref 0.0–1.0)
pH: 6 (ref 5.0–8.0)

## 2013-06-17 LAB — URINE MICROSCOPIC-ADD ON

## 2013-06-17 LAB — PREGNANCY, URINE: Preg Test, Ur: POSITIVE — AB

## 2013-06-17 MED ORDER — ONDANSETRON 8 MG PO TBDP: 8.0000 mg | ORAL_TABLET | Freq: Three times a day (TID) | ORAL | Status: DC | PRN

## 2013-06-17 MED ORDER — SODIUM CHLORIDE 0.9 % IV BOLUS (SEPSIS)
1000.0000 mL | Freq: Once | INTRAVENOUS | Status: AC
  Administered 2013-06-17: 1000 mL via INTRAVENOUS

## 2013-06-17 MED ORDER — SODIUM CHLORIDE 0.9 % IV SOLN: INTRAVENOUS | Status: DC

## 2013-06-17 MED ORDER — ONDANSETRON HCL 4 MG/2ML IJ SOLN
4.0000 mg | Freq: Once | INTRAMUSCULAR | Status: AC
  Administered 2013-06-17: 4 mg via INTRAVENOUS
  Filled 2013-06-17: qty 2

## 2013-06-17 NOTE — Discharge Instructions (Signed)
Viral Infections °A virus is a type of germ. Viruses can cause: °· Minor sore throats. °· Aches and pains. °· Headaches. °· Runny nose. °· Rashes. °· Watery eyes. °· Tiredness. °· Coughs. °· Loss of appetite. °· Feeling sick to your stomach (nausea). °· Throwing up (vomiting). °· Watery poop (diarrhea). °HOME CARE  °· Only take medicines as told by your doctor. °· Drink enough water and fluids to keep your pee (urine) clear or pale yellow. Sports drinks are a good choice. °· Get plenty of rest and eat healthy. Soups and broths with crackers or rice are fine. °GET HELP RIGHT AWAY IF:  °· You have a very bad headache. °· You have shortness of breath. °· You have chest pain or neck pain. °· You have an unusual rash. °· You cannot stop throwing up. °· You have watery poop that does not stop. °· You cannot keep fluids down. °· You or your child has a temperature by mouth above 102° F (38.9° C), not controlled by medicine. °· Your baby is older than 3 months with a rectal temperature of 102° F (38.9° C) or higher. °· Your baby is 3 months old or younger with a rectal temperature of 100.4° F (38° C) or higher. °MAKE SURE YOU:  °· Understand these instructions. °· Will watch this condition. °· Will get help right away if you are not doing well or get worse. °Document Released: 04/27/2008 Document Revised: 08/07/2011 Document Reviewed: 09/20/2010 °Exit[employer]® Patient Information ©2014 [employer]. ° °

## 2013-06-17 NOTE — ED Provider Notes (Signed)
CSN: [id number]     Arrival date & time 06/17/13  0541 History   First MD Initiated Contact with Patient 06/17/13 0700     Chief Complaint  Patient presents with  . Generalized Body Aches   (Consider location/radiation/quality/duration/timing/severity/associated sxs/prior Treatment) The history is provided by the patient.   Patient here complaining of watery diarrhea and nonbilious emesis x24 hours. Has had a productive cough as well 2.Subjective fever. Used over-the-counter medications with no relief. Some ear pain and sore throat without trouble swallowing. Denies any urinary symptoms. Symptoms have been persistent and nothing makes them better worse. Patient has not had a flu shot. Past Medical History  Diagnosis Date  . Pre-eclampsia in second trimester    Past Surgical History  Procedure Laterality Date  . Cesarean section     History reviewed. No pertinent family history. History  Substance Use Topics  . Smoking status: Current Every Day Smoker -- 1.00 packs/day    Types: Cigarettes  . Smokeless tobacco: Not on file  . Alcohol Use: Yes   OB History   Grav Para Term Preterm Abortions TAB SAB Ect Mult Living   3 2             Review of Systems  All other systems reviewed and are negative.    Allergies  Nitrofurantoin monohyd macro  Home Medications   Current Outpatient Rx  Name  Route  Sig  Dispense  Refill  . Phenyleph-Doxylamine-DM-APAP (ALKA-SELTZER PLS NIGHT CLD/FLU) 5-6.25-10-325 MG CAPS   Oral   Take 1 capsule by mouth every 8 (eight) hours as needed (cold symptoms).          BP 105/46  Pulse 82  Temp(Src) 98.3 F (36.8 C) (Oral)  Resp 20  Ht 5\' 9"  (1.753 m)  Wt 240 lb (108.863 kg)  BMI 35.43 kg/m2  SpO2 100%  LMP 05/24/2013 Physical Exam  Nursing note and vitals reviewed. Constitutional: She is oriented to person, place, and time. She appears well-developed and well-nourished.  Non-toxic appearance. No distress.  HENT:  Head: Normocephalic  and atraumatic.  Eyes: Conjunctivae, EOM and lids are normal. Pupils are equal, round, and reactive to light.  Neck: Normal range of motion. Neck supple. No tracheal deviation present. No mass present.  Cardiovascular: Normal rate, regular rhythm and normal heart sounds.  Exam reveals no gallop.   No murmur heard. Pulmonary/Chest: Effort normal and breath sounds normal. No stridor. No respiratory distress. She has no decreased breath sounds. She has no wheezes. She has no rhonchi. She has no rales.  Abdominal: Soft. Normal appearance and bowel sounds are normal. She exhibits no distension. There is no tenderness. There is no rebound and no CVA tenderness.  Musculoskeletal: Normal range of motion. She exhibits no edema and no tenderness.  Neurological: She is alert and oriented to person, place, and time. She has normal strength. No cranial nerve deficit or sensory deficit. GCS eye subscore is 4. GCS verbal subscore is 5. GCS motor subscore is 6.  Skin: Skin is warm and dry. No abrasion and no rash noted.  Psychiatric: She has a normal mood and affect. Her speech is normal and behavior is normal.    ED Course  Procedures (including critical care time) Labs Review Labs Reviewed - No data to display Imaging Review No results found.  EKG Interpretation   None       MDM  No diagnosis found. She given IV fluids and Zofran and feels better. She was offered Tamiflu  and has declined. Urinalysis results noted and likely contaminated. Urine culture has been sent. Patient has no evidence of abdominal pain at this time and therefore no concern for ectopic pregnancy. Patient followup with her GYN doctor    Toy BakerAnthony T Mally Gavina, MD 06/17/13 [phone]

## 2013-06-17 NOTE — ED Notes (Addendum)
Patient is alert and oriented x3.  She is complaining of generalized body aches.  [facility] for [facility] and states that she has had no relief

## 2013-06-17 NOTE — ED Notes (Signed)
Pt returned from xray

## 2013-06-17 NOTE — ED Notes (Signed)
Patient is alert and oriented x3.  She called EMS due to flu like symptoms with generalized body aches. Currently she rates her pain 9 of 10 without nausea.

## 2013-06-17 NOTE — ED Notes (Signed)
Pt to xray att.

## 2013-06-19 LAB — URINE CULTURE

## 2013-06-20 ENCOUNTER — Telehealth (HOSPITAL_COMMUNITY): Payer: Self-pay | Admitting: *Deleted

## 2013-06-20 NOTE — ED Notes (Signed)
Post ED Visit - Positive Culture Follow-up: Successful Patient Follow-Up  Culture assessed and recommendations reviewed by: [x]  Wes Dulaney, Pharm.D., BCPS []  Celedonio MiyamotoJeremy Frens, Pharm.D., BCPS []  Georgina PillionElizabeth Martin, [address]Ph[address] BCPS []  ChincoteagueMinh Pham, [address]Pharm[address]D., BCPS, AAHIVP []  Estella HuskMichelle Turner, Pharm.D., BCPS, AAHIVP  Positive Urine culture  []  Patient discharged without antimicrobial prescription and treatment is now indicated [x]  Organism is resistant to prescribed ED discharge antimicrobial []  Patient with positive blood cultures  Changes discussed with ED provider:Lisa Allyne GeeSanders New antibiotic prescription Keflex 500 mg po BID x 7 days   Contacted patient Left voice mail message    Rebecca Murillo, Rebecca Murillo Marie 06/20/2013, 4:37 PM

## 2013-06-20 NOTE — Progress Notes (Signed)
ED Antimicrobial Stewardship Positive Culture Follow Up   Rebecca Murillo is an 33 y.o. female [facility] with a chief complaint of  Chief Complaint  Patient presents with  . Generalized Body Aches    Recent Results (from the past 720 hour(s))  URINE CULTURE     Status: None   Collection Time    06/17/13  8:04 AM      Result Value Range Status   Specimen Description URINE, RANDOM   Final   Special Requests NONE   Final   Culture  Setup Time     Final   Value: 06/17/2013 11:33     [facility]formed at [facility]   Colony Count     Final   Value: >=100,000 COLONIES/ML     [facility] at [facility]   Culture     Final   Value: KLEBSIELLA PNEUMONIAE     [facility]formed at [facility]   Report Status 06/19/2013 FINAL   Final   Organism ID, Bacteria KLEBSIELLA PNEUMONIAE   Final   Patient discharged originally without antimicrobial agent and treatment is now indicated. Patient also had a positive urine test for pregnancy.   Recommendations: 1. Request patient to follow-up with OBGYN 2. Keflex 500mg  PO BID x 7 days  ED Provider: Sharilyn SitesLisa [name], PA-C   Cleon DewDulaney,  Robert 06/20/2013, 9:25 AM Infectious Diseases Pharmacist Phone# [phone]

## 2014-03-30 ENCOUNTER — Encounter (HOSPITAL_COMMUNITY): Payer: Self-pay | Admitting: Emergency Medicine

## 2014-06-24 ENCOUNTER — Encounter (HOSPITAL_BASED_OUTPATIENT_CLINIC_OR_DEPARTMENT_OTHER): Payer: Self-pay

## 2014-06-24 ENCOUNTER — Emergency Department (HOSPITAL_BASED_OUTPATIENT_CLINIC_OR_DEPARTMENT_OTHER)
Admission: EM | Admit: 2014-06-24 | Discharge: 2014-06-25 | Disposition: A | Discharge status: Home / Self Care | Payer: Medicaid Other | Attending: Emergency Medicine | Admitting: Emergency Medicine

## 2014-06-24 DIAGNOSIS — S3992XA Unspecified injury of lower back, initial encounter: Secondary | ICD-10-CM | POA: Insufficient documentation

## 2014-06-24 DIAGNOSIS — Y9289 Other specified places as the place of occurrence of the external cause: Secondary | ICD-10-CM | POA: Diagnosis not present

## 2014-06-24 DIAGNOSIS — S24109A Unspecified injury at unspecified level of thoracic spinal cord, initial encounter: Secondary | ICD-10-CM | POA: Diagnosis present

## 2014-06-24 DIAGNOSIS — M546 Pain in thoracic spine: Secondary | ICD-10-CM

## 2014-06-24 DIAGNOSIS — Y9389 Activity, other specified: Secondary | ICD-10-CM | POA: Insufficient documentation

## 2014-06-24 DIAGNOSIS — W19XXXA Unspecified fall, initial encounter: Secondary | ICD-10-CM

## 2014-06-24 DIAGNOSIS — Y998 Other external cause status: Secondary | ICD-10-CM | POA: Diagnosis not present

## 2014-06-24 DIAGNOSIS — W000XXA Fall on same level due to ice and snow, initial encounter: Secondary | ICD-10-CM | POA: Diagnosis not present

## 2014-06-24 DIAGNOSIS — Z720 Tobacco use: Secondary | ICD-10-CM | POA: Insufficient documentation

## 2014-06-24 NOTE — ED Notes (Signed)
Slipped on ice yesterday am-pain to neck and entire back

## 2014-06-25 MED ORDER — HYDROCODONE-ACETAMINOPHEN 5-325 MG PO TABS: 1.0000 | ORAL_TABLET | ORAL | Status: DC | PRN

## 2014-06-25 MED ORDER — IBUPROFEN 600 MG PO TABS: 600.0000 mg | ORAL_TABLET | Freq: Three times a day (TID) | ORAL | Status: DC | PRN

## 2014-06-25 MED ORDER — KETOROLAC TROMETHAMINE 60 MG/2ML IM SOLN
60.0000 mg | Freq: Once | INTRAMUSCULAR | Status: AC
  Administered 2014-06-25: 60 mg via INTRAMUSCULAR
  Filled 2014-06-25: qty 2

## 2014-06-25 NOTE — ED Provider Notes (Signed)
CSN: [id number]     Arrival date & time 06/24/14  2127 History   First MD Initiated Contact with Patient 06/25/14 0013     Chief Complaint  Patient presents with  . Fall     The history is provided by the patient.   patient reports slipping and falling yesterday on the ice.  She states that she fell backwards and struck her back.  She denies head injury.  She denies upper neck pain.  She reports pain mainly to her scapular and thoracic and lumbar regions.  She denies weakness of her arms or legs.  She denies chest pain or shortness of breath.  She denies abdominal pain.  She is not on anticoagulants.  Her pain is mild to moderate in severity and worse with movement and palpation  Past Medical History  Diagnosis Date  . Pre-eclampsia in second trimester    Past Surgical History  Procedure Laterality Date  . Cesarean section     No family history on file. History  Substance Use Topics  . Smoking status: Current Every Day Smoker -- 1.00 packs/day    Types: Cigarettes  . Smokeless tobacco: Not on file  . Alcohol Use: Yes   OB History    Gravida Para Term Preterm AB TAB SAB Ectopic Multiple Living   3 2             Review of Systems  All other systems reviewed and are negative.     Allergies  Nitrofurantoin monohyd macro  Home Medications   Prior to Admission medications   Not on File   BP 131/64 mmHg  Pulse 88  Temp(Src) 98.6 F (37 C) (Oral)  Resp 20  Ht 5\' 9"  (1.753 m)  Wt 230 lb (104.327 kg)  BMI 33.95 kg/m2  SpO2 98% Physical Exam  Constitutional: She is oriented to person, place, and time. She appears well-developed and well-nourished. No distress.  HENT:  Head: Normocephalic and atraumatic.  Eyes: EOM are normal.  Neck: Normal range of motion. Neck supple.  No c spine tenderness. C spine cleared by nexus criteria  Cardiovascular: Normal rate, regular rhythm and normal heart sounds.   Pulmonary/Chest: Effort normal and breath sounds normal.  Abdominal:  Soft. She exhibits no distension. There is no tenderness.  Musculoskeletal: Normal range of motion.  No thoracic or lumbar point tenderness.  Mild paralumbar and parathoracic tenderness without spasm  Neurological: She is alert and oriented to person, place, and time.  Skin: Skin is warm and dry.  Psychiatric: She has a normal mood and affect. Judgment normal.  Nursing note and vitals reviewed.   ED Course  Procedures (including critical care time) Labs Review Labs Reviewed - No data to display  Imaging Review No results found.   EKG Interpretation None      MDM   Final diagnoses:  None    Doubt traumatic fracture of her cervical, thoracic, lumbar spine.  No indication for imaging.  C-spine is cleared by Nexus criteria.  Likely muscular in nature.  Home with a short course of pain medication    Lyanne CoKevin M Taiesha Bovard, MD 06/25/14 [phone]

## 2015-01-06 ENCOUNTER — Emergency Department (HOSPITAL_BASED_OUTPATIENT_CLINIC_OR_DEPARTMENT_OTHER)
Admission: EM | Admit: 2015-01-06 | Discharge: 2015-01-06 | Disposition: A | Discharge status: Home / Self Care | Payer: Medicaid Other | Attending: Emergency Medicine | Admitting: Emergency Medicine

## 2015-01-06 ENCOUNTER — Encounter (HOSPITAL_BASED_OUTPATIENT_CLINIC_OR_DEPARTMENT_OTHER): Payer: Self-pay | Admitting: *Deleted

## 2015-01-06 DIAGNOSIS — R1013 Epigastric pain: Secondary | ICD-10-CM | POA: Diagnosis present

## 2015-01-06 DIAGNOSIS — N938 Other specified abnormal uterine and vaginal bleeding: Secondary | ICD-10-CM | POA: Insufficient documentation

## 2015-01-06 DIAGNOSIS — Z720 Tobacco use: Secondary | ICD-10-CM | POA: Diagnosis not present

## 2015-01-06 DIAGNOSIS — Z3202 Encounter for pregnancy test, result negative: Secondary | ICD-10-CM | POA: Diagnosis not present

## 2015-01-06 DIAGNOSIS — K219 Gastro-esophageal reflux disease without esophagitis: Secondary | ICD-10-CM | POA: Diagnosis not present

## 2015-01-06 DIAGNOSIS — IMO0001 Reserved for inherently not codable concepts without codable children: Secondary | ICD-10-CM

## 2015-01-06 DIAGNOSIS — K921 Melena: Secondary | ICD-10-CM | POA: Diagnosis not present

## 2015-01-06 HISTORY — DX: Palpitations: R00.2

## 2015-01-06 LAB — BASIC METABOLIC PANEL
ANION GAP: 8 (ref 5–15)
BUN: 10 mg/dL (ref 6–20)
CHLORIDE: 108 mmol/L (ref 101–111)
CO2: 22 mmol/L (ref 22–32)
CREATININE: 0.82 mg/dL (ref 0.44–1.00)
Calcium: 9 mg/dL (ref 8.9–10.3)
GFR calc non Af Amer: >60 mL/min (ref >60)
Glucose, Bld: 103 mg/dL — ABNORMAL HIGH (ref 65–99)
Potassium: 3.6 mmol/L (ref 3.5–5.1)
Sodium: 138 mmol/L (ref 135–145)

## 2015-01-06 LAB — CBC WITH DIFFERENTIAL/PLATELET
BASOS ABS: 0 10*3/uL (ref 0.0–0.1)
Basophils Relative: 0 % (ref 0–1)
Eosinophils Absolute: 0 10*3/uL (ref 0.0–0.7)
Eosinophils Relative: 1 % (ref 0–5)
HCT: 40.1 % (ref 36.0–46.0)
HEMOGLOBIN: 13.7 g/dL (ref 12.0–15.0)
LYMPHS ABS: 2 10*3/uL (ref 0.7–4.0)
Lymphocytes Relative: 36 % (ref 12–46)
MCH: 31.8 pg (ref 26.0–34.0)
MCHC: 34.2 g/dL (ref 30.0–36.0)
MCV: 93 fL (ref 78.0–100.0)
Monocytes Absolute: 0.4 10*3/uL (ref 0.1–1.0)
Monocytes Relative: 8 % (ref 3–12)
NEUTROS PCT: 55 % (ref 43–77)
Neutro Abs: 3.1 10*3/uL (ref 1.7–7.7)
PLATELETS: 158 10*3/uL (ref 150–400)
RBC: 4.31 MIL/uL (ref 3.87–5.11)
RDW: 13 % (ref 11.5–15.5)
WBC: 5.5 10*3/uL (ref 4.0–10.5)

## 2015-01-06 LAB — URINALYSIS, ROUTINE W REFLEX MICROSCOPIC
BILIRUBIN URINE: NEGATIVE
Glucose, UA: NEGATIVE mg/dL
KETONES UR: NEGATIVE mg/dL
Leukocytes, UA: NEGATIVE
NITRITE: NEGATIVE
PH: 6 (ref 5.0–8.0)
Protein, ur: NEGATIVE mg/dL
SPECIFIC GRAVITY, URINE: 1.021 (ref 1.005–1.030)
Urobilinogen, UA: 0.2 mg/dL (ref 0.0–1.0)

## 2015-01-06 LAB — URINE MICROSCOPIC-ADD ON

## 2015-01-06 LAB — OCCULT BLOOD X 1 CARD TO LAB, STOOL: Fecal Occult Bld: POSITIVE — AB

## 2015-01-06 LAB — PREGNANCY, URINE: Preg Test, Ur: NEGATIVE

## 2015-01-06 MED ORDER — OMEPRAZOLE 20 MG PO CPDR: 20.0000 mg | DELAYED_RELEASE_CAPSULE | Freq: Two times a day (BID) | ORAL | Status: DC

## 2015-01-06 MED ORDER — AMOXICILLIN 500 MG PO CAPS: 1000.0000 mg | ORAL_CAPSULE | Freq: Two times a day (BID) | ORAL | Status: DC

## 2015-01-06 MED ORDER — SUCRALFATE 1 G PO TABS: 1.0000 g | ORAL_TABLET | Freq: Three times a day (TID) | ORAL | Status: DC

## 2015-01-06 MED ORDER — CLARITHROMYCIN 500 MG PO TABS: 500.0000 mg | ORAL_TABLET | Freq: Two times a day (BID) | ORAL | Status: DC

## 2015-01-06 NOTE — ED Notes (Signed)
In July pt states she had menstral period x 2. C/o wakes up with hands tingling and tingling around eyes. C/o 1 black stool. States generally not feeling well.. Also c/o left upper arm bruising with unknown cause. 2+radial.

## 2015-01-06 NOTE — Discharge Instructions (Signed)
Peptic Ulcer A peptic ulcer is a sore in the lining of your esophagus (esophageal ulcer), stomach (gastric ulcer), or in the first part of your small intestine (duodenal ulcer). The ulcer causes erosion into the deeper tissue. CAUSES  Normally, the lining of the stomach and the small intestine protects itself from the acid that digests food. The protective lining can be damaged by:  An infection caused by a bacterium called Helicobacter pylori (H. pylori).  Regular use of nonsteroidal anti-inflammatory drugs (NSAIDs), such as ibuprofen or aspirin.  Smoking tobacco. Other risk factors include being older than 50, drinking alcohol excessively, and having a family history of ulcer disease.  SYMPTOMS   Burning pain or gnawing in the area between the chest and the belly button.  Heartburn.  Nausea and vomiting.  Bloating. The pain can be worse on an empty stomach and at night. If the ulcer results in bleeding, it can cause:  Black, tarry stools.  Vomiting of bright red blood.  Vomiting of coffee-ground-looking materials. DIAGNOSIS  A diagnosis is usually made based upon your history and an exam. Other tests and procedures may be performed to find the cause of the ulcer. Finding a cause will help determine the best treatment. Tests and procedures may include:  Blood tests, stool tests, or breath tests to check for the bacterium H. pylori.  An upper gastrointestinal (GI) series of the esophagus, stomach, and small intestine.  An endoscopy to examine the esophagus, stomach, and small intestine.  A biopsy. TREATMENT  Treatment may include:  Eliminating the cause of the ulcer, such as smoking, NSAIDs, or alcohol.  Medicines to reduce the amount of acid in your digestive tract.  Antibiotic medicines if the ulcer is caused by the H. pylori bacterium.  An upper endoscopy to treat a bleeding ulcer.  Surgery if the bleeding is severe or if the ulcer created a hole somewhere in the  digestive system. HOME CARE INSTRUCTIONS   Avoid tobacco, alcohol, and caffeine. Smoking can increase the acid in the stomach, and continued smoking will impair the healing of ulcers.  Avoid foods and drinks that seem to cause discomfort or aggravate your ulcer.  Only take medicines as directed by your caregiver. Do not substitute over-the-counter medicines for prescription medicines without talking to your caregiver.  Keep any follow-up appointments and tests as directed. SEEK MEDICAL CARE IF:   Your do not improve within 7 days of starting treatment.  You have ongoing indigestion or heartburn. SEEK IMMEDIATE MEDICAL CARE IF:   You have sudden, sharp, or persistent abdominal pain.  You have bloody or dark black, tarry stools.  You vomit blood or vomit that looks like coffee grounds.  You become light-headed, weak, or feel faint.  You become sweaty or clammy. MAKE SURE YOU:   Understand these instructions.  Will watch your condition.  Will get help right away if you are not doing well or get worse. Document Released: 05/12/2000 Document Revised: 09/29/2013 Document Reviewed: 12/13/2011 [facility] Patient Information 2015 [place], [place]. This information is not intended to replace advice given to you by your health care provider. Make sure you discuss any questions you have with your health care provider. Peptic Ulcer A peptic ulcer is a sore in the lining of your esophagus (esophageal ulcer), stomach (gastric ulcer), or in the first part of your small intestine (duodenal ulcer). The ulcer causes erosion into the deeper tissue. CAUSES  Normally, the lining of the stomach and the small intestine protects itself from the  acid that digests food. The protective lining can be damaged by:  An infection caused by a bacterium called Helicobacter pylori (H. pylori).  Regular use of nonsteroidal anti-inflammatory drugs (NSAIDs), such as ibuprofen or aspirin.  Smoking tobacco. Other  risk factors include being older than 50, drinking alcohol excessively, and having a family history of ulcer disease.  SYMPTOMS   Burning pain or gnawing in the area between the chest and the belly button.  Heartburn.  Nausea and vomiting.  Bloating. The pain can be worse on an empty stomach and at night. If the ulcer results in bleeding, it can cause:  Black, tarry stools.  Vomiting of bright red blood.  Vomiting of coffee-ground-looking materials. DIAGNOSIS  A diagnosis is usually made based upon your history and an exam. Other tests and procedures may be performed to find the cause of the ulcer. Finding a cause will help determine the best treatment. Tests and procedures may include:  Blood tests, stool tests, or breath tests to check for the bacterium H. pylori.  An upper gastrointestinal (GI) series of the esophagus, stomach, and small intestine.  An endoscopy to examine the esophagus, stomach, and small intestine.  A biopsy. TREATMENT  Treatment may include:  Eliminating the cause of the ulcer, such as smoking, NSAIDs, or alcohol.  Medicines to reduce the amount of acid in your digestive tract.  Antibiotic medicines if the ulcer is caused by the H. pylori bacterium.  An upper endoscopy to treat a bleeding ulcer.  Surgery if the bleeding is severe or if the ulcer created a hole somewhere in the digestive system. HOME CARE INSTRUCTIONS   Avoid tobacco, alcohol, and caffeine. Smoking can increase the acid in the stomach, and continued smoking will impair the healing of ulcers.  Avoid foods and drinks that seem to cause discomfort or aggravate your ulcer.  Only take medicines as directed by your caregiver. Do not substitute over-the-counter medicines for prescription medicines without talking to your caregiver.  Keep any follow-up appointments and tests as directed. SEEK MEDICAL CARE IF:   Your do not improve within 7 days of starting treatment.  You have ongoing  indigestion or heartburn. SEEK IMMEDIATE MEDICAL CARE IF:   You have sudden, sharp, or persistent abdominal pain.  You have bloody or dark black, tarry stools.  You vomit blood or vomit that looks like coffee grounds.  You become light-headed, weak, or feel faint.  You become sweaty or clammy. MAKE SURE YOU:   Understand these instructions.  Will watch your condition.  Will get help right away if you are not doing well or get worse. Document Released: 05/12/2000 Document Revised: 09/29/2013 Document Reviewed: 12/13/2011 [facility] Patient Information 2015 [place], [place]. This information is not intended to replace advice given to you by your health care provider. Make sure you discuss any questions you have with your health care provider.    Abnormal Uterine Bleeding Abnormal uterine bleeding can affect women at various stages in life, including teenagers, women in their reproductive years, pregnant women, and women who have reached menopause. Several kinds of uterine bleeding are considered abnormal, including:  Bleeding or spotting between periods.   Bleeding after sexual intercourse.   Bleeding that is heavier or more than normal.   Periods that last longer than usual.  Bleeding after menopause.  Many cases of abnormal uterine bleeding are minor and simple to treat, while others are more serious. Any type of abnormal bleeding should be evaluated by your health care provider. Treatment  will depend on the cause of the bleeding. HOME CARE INSTRUCTIONS Monitor your condition for any changes. The following actions may help to alleviate any discomfort you are experiencing:  Avoid the use of tampons and douches as directed by your health care provider.  Change your pads frequently. You should get regular pelvic exams and Pap tests. Keep all follow-up appointments for diagnostic tests as directed by your health care provider.  SEEK MEDICAL CARE IF:   Your bleeding lasts  more than 1 week.   You feel dizzy at times.  SEEK IMMEDIATE MEDICAL CARE IF:   You pass out.   You are changing pads every 15 to 30 minutes.   You have abdominal pain.  You have a fever.   You become sweaty or weak.   You are passing large blood clots from the vagina.   You start to feel nauseous and vomit. MAKE SURE YOU:   Understand these instructions.  Will watch your condition.  Will get help right away if you are not doing well or get worse. Document Released: 05/15/2005 Document Revised: 05/20/2013 Document Reviewed: 12/12/2012 [facility] Patient Information 2015 [place], [place]. This information is not intended to replace advice given to you by your health care provider. Make sure you discuss any questions you have with your health care provider.

## 2015-01-06 NOTE — ED Provider Notes (Signed)
CSN: [id number]     Arrival date & time 01/06/15  1013 History   First MD Initiated Contact with Patient 01/06/15 1206     No chief complaint on file.    (Consider location/radiation/quality/duration/timing/severity/associated sxs/prior Treatment) HPI  Rebecca Murillo Is a 34 year old female who presents emergency Department with multiple complaints. 1. Patient complains of having abnormal menstruation beginning last month. She has a neck splint on implant in her left arm. Patient states she normally does not get up. Last month, she got her period at the beginning of the month, had 1 day without a period. Then began again lasting for approximately 7-10 days. She states that bleeding was normal. She has never had irregular periods before. She denies vaginal symptoms, dyspareunia, urinary symptoms. She is newly married and sexually active with a single female partner. 2. The patient also complains of abdominal pain and new onset, black tarry stools. She is a current daily smoker, she uses Excedrin migraines daily, drinks caffeine and eats very spicy foods on a regular basis. She has had long-standing reflux. Her brother has a history of ulcers. The patient complains of postprandial pain with eating, which has been long standing. She denies nausea or vomiting. Since the beginning of last month. She has had several episodes of black tarry stools and today has a positive fecal occult. She denies a history of hemorrhoids. She denies bright red blood per rectum. She is not taking any medications to treat her reflux. She deneis light headedness, fatigue or cold intolerance. 3) The patient c/o bruising to the Left upper arm after sleeping on it and facial numbness after sleeping on her face.   Past Medical History  Diagnosis Date  . Pre-eclampsia in second trimester   . Heart palpitations    Past Surgical History  Procedure Laterality Date  . Cesarean section     No family history on file. Social  History  Substance Use Topics  . Smoking status: Current Every Day Smoker -- 1.00 packs/day    Types: Cigarettes  . Smokeless tobacco: None  . Alcohol Use: Yes   OB History    Gravida Para Term Preterm AB TAB SAB Ectopic Multiple Living   3 2             Review of Systems  Ten systems reviewed and are negative for acute change, except as noted in the HPI.    Allergies  Nitrofurantoin monohyd macro  Home Medications   Prior to Admission medications   Medication Sig Start Date End Date Taking? Authorizing Provider  amoxicillin (AMOXIL) 500 MG capsule Take 2 capsules (1,000 mg total) by mouth 2 (two) times daily. 01/06/15   Arthor Captain, PA-C  clarithromycin (BIAXIN) 500 MG tablet Take 1 tablet (500 mg total) by mouth 2 (two) times daily. 01/06/15   Arthor Captain, PA-C  omeprazole (PRILOSEC) 20 MG capsule Take 1 capsule (20 mg total) by mouth 2 (two) times daily before a meal. 01/06/15   Arthor Captain, PA-C  sucralfate (CARAFATE) 1 G tablet Take 1 tablet (1 g total) by mouth 4 (four) times daily -  with meals and at bedtime. 01/06/15   Haislee Corso, PA-C   BP 128/88 mmHg  Pulse 60  Temp(Src) 98.5 F (36.9 C) (Oral)  Resp 16  Ht 5\' 9"  (1.753 m)  Wt 230 lb (104.327 kg)  BMI 33.95 kg/m2  SpO2 100% Physical Exam  Constitutional: She is oriented to person, place, and time. She appears well-developed and well-nourished. No  distress.  HENT:  Head: Normocephalic and atraumatic.  Eyes: Conjunctivae are normal. No scleral icterus.  Neck: Normal range of motion.  Cardiovascular: Normal rate, regular rhythm and normal heart sounds.  Exam reveals no gallop and no friction rub.   No murmur heard. Pulmonary/Chest: Effort normal and breath sounds normal. No respiratory distress.  Abdominal: Soft. Bowel sounds are normal. She exhibits no distension and no mass. There is no tenderness. There is no guarding.    Musculoskeletal:       Arms: Neurological: She is alert and oriented to  person, place, and time.  Skin: Skin is warm and dry. No bruising, no ecchymosis, no petechiae and no rash noted. She is not diaphoretic.  Nursing note and vitals reviewed.   ED Course  Procedures (including critical care time) Labs Review Labs Reviewed  BASIC METABOLIC PANEL - Abnormal; Notable for the following:    Glucose, Bld 103 (*)    All other components within normal limits  URINALYSIS, ROUTINE W REFLEX MICROSCOPIC (NOT AT [facility]) - Abnormal; Notable for the following:    Hgb urine dipstick TRACE (*)    All other components within normal limits  OCCULT BLOOD X 1 CARD TO LAB, STOOL - Abnormal; Notable for the following:    Fecal Occult Bld POSITIVE (*)    All other components within normal limits  CBC WITH DIFFERENTIAL/PLATELET  PREGNANCY, URINE  URINE MICROSCOPIC-ADD ON    Imaging Review No results found.   EKG Interpretation None      MDM   Final diagnoses:  DUB (dysfunctional uterine bleeding)  Epigastric pain  Reflux  Melena    1) patient with DUB in the setting of  Nexplanon. No urinary or vaginal complaints. No active bleeding today. Will have the patient follow up with her OB/gyn to discuss DUB. May need alternative form of birth control.  2)Melena and suspected PUD. Family hx of PUD. Will refer to gastroenterology. Treat empirically for H. Pylori Lifestyle modifications discussed.   Labs and PE reassuring. No acute anemia. Discussed sxs of acute anemia and reasons to return to the ED.    Arthor Captain, PA-C 01/06/15 2151  Zadie Rhine, MD 01/08/15 1101

## 2015-11-26 ENCOUNTER — Emergency Department (HOSPITAL_BASED_OUTPATIENT_CLINIC_OR_DEPARTMENT_OTHER)
Admission: EM | Admit: 2015-11-26 | Discharge: 2015-11-26 | Disposition: A | Discharge status: Home / Self Care | Payer: Medicaid Other | Attending: Emergency Medicine | Admitting: Emergency Medicine

## 2015-11-26 ENCOUNTER — Encounter (HOSPITAL_BASED_OUTPATIENT_CLINIC_OR_DEPARTMENT_OTHER): Payer: Self-pay | Admitting: *Deleted

## 2015-11-26 DIAGNOSIS — Z4800 Encounter for change or removal of nonsurgical wound dressing: Secondary | ICD-10-CM | POA: Insufficient documentation

## 2015-11-26 DIAGNOSIS — Z5189 Encounter for other specified aftercare: Secondary | ICD-10-CM

## 2015-11-26 DIAGNOSIS — F17210 Nicotine dependence, cigarettes, uncomplicated: Secondary | ICD-10-CM | POA: Insufficient documentation

## 2015-11-26 MED ORDER — TETANUS-DIPHTH-ACELL PERTUSSIS 5-2.5-18.5 LF-MCG/0.5 IM SUSP
0.5000 mL | Freq: Once | INTRAMUSCULAR | Status: AC
  Administered 2015-11-26: 0.5 mL via INTRAMUSCULAR
  Filled 2015-11-26: qty 0.5

## 2015-11-26 NOTE — ED Provider Notes (Signed)
CSN: [id number]     Arrival date & time 11/26/15  1626 History   First MD Initiated Contact with Patient 11/26/15 1639     Chief Complaint  Patient presents with  . Wound Check   Patient is a 35 y.o. female presenting with skin laceration.  Laceration Location:  Leg and foot Leg laceration location:  L lower leg Foot laceration location:  Top of L foot Length (cm):  6 cm, 1 cm Quality: straight   Bleeding: controlled   Time since incident:  1 week Laceration mechanism:  Broken glass Foreign body present:  No foreign bodies Worsened by:  Pressure Tetanus status:  Unknown  Rebecca Murillo is a 35 year old female presenting for a wound check. Pt was moving a glass table last week when it slipped and cut her left leg and foot. She did not seek medical care at that time. She has been using neosporin, hydrogen peroxide and gauze bandages to keep the wound clean and covered. She is requesting a wound check because the lacerations are not completely healed. She states that she is still having tenderness around the lacerations when they are touched. The wounds have intermittently drained straw colored fluid over the past week. No drainage reported today. Denies purulent drainage or foul odor from the wounds. Denies redness of the skin around the wounds, streaking or warmth. Denies swelling of the left lower extremity. Denies systemic symptoms including fever, chills, nausea or vomiting. No hx of diabetes or other immunocompromising disease.   Past Medical History  Diagnosis Date  . Pre-eclampsia in second trimester   . Heart palpitations    Past Surgical History  Procedure Laterality Date  . Cesarean section     No family history on file. Social History  Substance Use Topics  . Smoking status: Current Every Day Smoker -- 0.00 packs/day    Types: Cigarettes  . Smokeless tobacco: None  . Alcohol Use: Yes   OB History    Gravida Para Term Preterm AB TAB SAB Ectopic Multiple Living   3 2              Review of Systems  All other systems reviewed and are negative.     Allergies  Nitrofurantoin monohyd macro  Home Medications   Prior to Admission medications   Medication Sig Start Date End Date Taking? Authorizing Provider  amoxicillin (AMOXIL) 500 MG capsule Take 2 capsules (1,000 mg total) by mouth 2 (two) times daily. 01/06/15   Arthor CaptainAbigail Harris, PA-C  clarithromycin (BIAXIN) 500 MG tablet Take 1 tablet (500 mg total) by mouth 2 (two) times daily. 01/06/15   Arthor CaptainAbigail Harris, PA-C  omeprazole (PRILOSEC) 20 MG capsule Take 1 capsule (20 mg total) by mouth 2 (two) times daily before a meal. 01/06/15   Arthor CaptainAbigail Harris, PA-C  sucralfate (CARAFATE) 1 G tablet Take 1 tablet (1 g total) by mouth 4 (four) times daily -  with meals and at bedtime. 01/06/15   Abigail Harris, PA-C   BP 121/100 mmHg  Pulse 89  Temp(Src) 98.4 F (36.9 C) (Oral)  Resp 17  Ht 5\' 9"  (1.753 m)  Wt 104.327 kg  BMI 33.95 kg/m2  SpO2 100% Physical Exam  Constitutional: She appears well-developed and well-nourished. No distress.  Nontoxic appearing  HENT:  Head: Normocephalic and atraumatic.  Right Ear: External ear normal.  Left Ear: External ear normal.  Eyes: Conjunctivae are normal. Right eye exhibits no discharge. Left eye exhibits no discharge. No scleral icterus.  Neck: Normal range of motion.  Cardiovascular: Normal rate.   Pulmonary/Chest: Effort normal.  Musculoskeletal: Normal range of motion.  FROM of left lower extremity. Pt ambulates with a steady gait.   Neurological: She is alert. Coordination normal.  Sensation to light touch intact over left lower extremity.  Skin: Skin is warm and dry.  Approximately 6 cm linear laceration noted to anterior left lower leg. Wound is not gaping. There is crusted blood in wound line. No purulent or serous drainage. No fluctuance or induration suggesting deep tissue infection. No surrounding erythema or streaking. Mild TTP on palpation of wound. No warmth  or swelling of the leg.  Approximately 1 cm V shaped laceration to dorsal surface of left foot at base of 4th and 5th digits. Granulation tissue present at base of the wound. No purulent or serous drainage. No fluctuance or induration suggesting deep tissue infection. No surrounding erythema or streaking. Mild TTP on palpation of the wound. No warmth or swelling of the foot.   Psychiatric: She has a normal mood and affect. Her behavior is normal.  Nursing note and vitals reviewed.   ED Course  Procedures (including critical care time) Labs Review Labs Reviewed - No data to display  Imaging Review No results found. I have personally reviewed and evaluated these images and lab results as part of my medical decision-making.   EKG Interpretation None      MDM   Final diagnoses:  Visit for wound check   Pt for wound check. Pt received two lacerations to left lower extremity one week ago after dropping a glass table. Pt has been properly keeping the wound site clean and dry at home. The site is healthy appearing with signs of normal healing. No surrounding cellulitis or purulent drainage. No palpable fluctuance or induration suggesting deep tissues infection. Discussed home care and return precautions. Pt is stable for discharge     Rebecca HeimlichStevi Earley Grobe, PA-C 11/26/15 1801  Rebecca LefevreJulie Haviland, MD 11/26/15 2047

## 2015-11-26 NOTE — Discharge Instructions (Signed)
Follow up with your PCP as needed. Return to ED with signs of wound infection.  Wound Care Taking care of your wound properly can help to prevent pain and infection. It can also help your wound to heal more quickly.  HOW TO CARE FOR YOUR WOUND  Take or apply over-the-counter and prescription medicines only as told by your health care provider.  If you were prescribed antibiotic medicine, take or apply it as told by your health care provider. Do not stop using the antibiotic even if your condition improves.  Clean the wound each day or as told by your health care provider.  Wash the wound with mild soap and water.  Rinse the wound with water to remove all soap.  Pat the wound dry with a clean towel. Do not rub it.  There are many different ways to close and cover a wound. For example, a wound can be covered with stitches (sutures), skin glue, or adhesive strips. Follow instructions from your health care provider about:  How to take care of your wound.  When and how you should change your bandage (dressing).  When you should remove your dressing.  Removing whatever was used to close your wound.  Check your wound every day for signs of infection. Watch for:  Redness, swelling, or pain.  Fluid, blood, or pus.  Keep the dressing dry until your health care provider says it can be removed. Do not take baths, swim, use a hot tub, or do anything that would put your wound underwater until your health care provider approves.  Raise (elevate) the injured area above the level of your heart while you are sitting or lying down.  Do not scratch or pick at the wound.  Keep all follow-up visits as told by your health care provider. This is important. SEEK MEDICAL CARE IF:  You received a tetanus shot and you have swelling, severe pain, redness, or bleeding at the injection site.  You have a fever.  Your pain is not controlled with medicine.  You have increased redness, swelling, or  pain at the site of your wound.  You have fluid, blood, or pus coming from your wound.  You notice a bad smell coming from your wound or your dressing. SEEK IMMEDIATE MEDICAL CARE IF:  You have a red streak going away from your wound.   This information is not intended to replace advice given to you by your health care provider. Make sure you discuss any questions you have with your health care provider.   Document Released: 02/22/2008 Document Revised: 09/29/2014 Document Reviewed: 05/11/2014 Elsevier Interactive Patient Education [employer]

## 2015-11-26 NOTE — ED Notes (Signed)
She was cut with a glass table top a week ago. Here for examination of the wound. Laceration to her left lower leg and left foot. Both are in week old healing stages.

## 2016-01-03 ENCOUNTER — Emergency Department (HOSPITAL_BASED_OUTPATIENT_CLINIC_OR_DEPARTMENT_OTHER)
Admission: EM | Admit: 2016-01-03 | Discharge: 2016-01-03 | Disposition: A | Discharge status: Home / Self Care | Payer: Medicaid Other | Attending: Emergency Medicine | Admitting: Emergency Medicine

## 2016-01-03 ENCOUNTER — Encounter (HOSPITAL_BASED_OUTPATIENT_CLINIC_OR_DEPARTMENT_OTHER): Payer: Self-pay

## 2016-01-03 DIAGNOSIS — F419 Anxiety disorder, unspecified: Secondary | ICD-10-CM | POA: Insufficient documentation

## 2016-01-03 DIAGNOSIS — R0602 Shortness of breath: Secondary | ICD-10-CM | POA: Diagnosis present

## 2016-01-03 DIAGNOSIS — F17210 Nicotine dependence, cigarettes, uncomplicated: Secondary | ICD-10-CM | POA: Insufficient documentation

## 2016-01-03 LAB — PREGNANCY, URINE: Preg Test, Ur: NEGATIVE

## 2016-01-03 NOTE — Discharge Instructions (Signed)
You have been seen today for feelings of anxiety. Follow up with PCP as soon as possible to establish regular care for anxiety. Follow up with OB/GYN for any recurrence of vaginal discharge. Return to ED as needed.

## 2016-01-03 NOTE — ED Triage Notes (Signed)
Pt also c/o depression r/t recent father's death-states she did have thoughts of self harm approx 2 weeks ago however denies at present-began crying during triage-mother came in to triage room-pt asked that she not be present-mother left the room-pt became dizzy when ambulating to tx area-was taken to room 6 via w/c-all this reported to Velna OchsK Conner, RN and she has been in the room to see pt

## 2016-01-03 NOTE — ED Provider Notes (Signed)
[facility]-EMERGENCY DEPT Provider Note   CSN: [id number] Arrival date & time: 01/03/16  1522  First Provider Contact:  First MD Initiated Contact with Patient 01/03/16 1638     By signing my name below, I, Rebecca Murillo, attest that this documentation has been prepared under the direction and in the presence of Rebecca Callejo, PA-C. Electronically Signed: Angelene GiovanniEmmanuella Murillo, ED Scribe. 01/03/16. 5:00 PM.    History   Chief Complaint Chief Complaint  Patient presents with  . Shortness of Breath    HPI Comments: Rebecca Murillo is a 35 y.o. female with a hx of heart palpitations who presents to the Emergency Department complaining of ongoing intermittent episodes of moderate SOB with palpitations onset 3 months ago. Patient states she has had these problems on and off for many years, but they have gotten more frequent in recent months, associated with a string of extra stressors. She characterizes the palpitations as her "heart skips a beat". She adds that the SOB is worse when she is upset or thinks about what is going on in her life. Patient states she will start thinking about a stressful event, her heart will begin to be faster, she then starts to feel like she can't breathe as well. Once patient calms down, her symptoms subside. She denies currently being on medication for anxiety. No alleviating factors noted. Pt has not tried any medications PTA. She reports that she is a current smoker (approx 4 cigarettes a day). She denies any fever, chills, chest pain, cough, nausea/vomiting, dizziness, or any other complaints. Further denies thoughts or desire for self-harm.  She also mentions increased vaginal discharge onset 1.5 months ago. Denies abdominal pain. No alleviating factors noted. Pt has not tried any medications for these symptoms PTA. Patient states that she has an OB/GYN and would prefer to have them evaluate this issue.   The history is provided by the patient. No language interpreter  was used.    Past Medical History:  Diagnosis Date  . Heart palpitations   . Pre-eclampsia in second trimester     There are no active problems to display for this patient.   Past Surgical History:  Procedure Laterality Date  . CESAREAN SECTION      OB History    Gravida Para Term Preterm AB Living   3 2           SAB TAB Ectopic Multiple Live Births                   Home Medications    Prior to Admission medications   Not on File    Family History No family history on file.  Social History Social History  Substance Use Topics  . Smoking status: Current Every Day Smoker    Packs/day: 0.00    Types: Cigarettes  . Smokeless tobacco: Never Used  . Alcohol use Yes     Comment: occ     Allergies   Nitrofurantoin monohyd macro   Review of Systems Review of Systems  Constitutional: Negative for chills and fever.  Respiratory: Positive for shortness of breath (resolved). Negative for cough.   Cardiovascular: Positive for palpitations (resolved). Negative for chest pain.  Gastrointestinal: Negative for abdominal pain, blood in stool, constipation, diarrhea, nausea and vomiting.  Genitourinary: Positive for vaginal discharge.  All other systems reviewed and are negative.    Physical Exam Updated Vital Signs BP 121/81 (BP Location: Left Arm)   Pulse 74   Temp 98 F (36.7  C) (Oral)   Resp 20   Ht  (1.753 m)   Wt 200 lb (90.7 kg)   LMP 11/27/2015   SpO2 98%   BMI 29.53 kg/m   Physical Exam  Constitutional: She appears well-developed and well-nourished. No distress.  HENT:  Head: Normocephalic and atraumatic.  Eyes: Conjunctivae are normal.  Neck: Neck supple.  Cardiovascular: Normal rate, regular rhythm, normal heart sounds and intact distal pulses.   Pulmonary/Chest: Effort normal and breath sounds normal. No respiratory distress.  Abdominal: Soft. There is no tenderness. There is no guarding.  Musculoskeletal: She exhibits no edema or  tenderness.  Lymphadenopathy:    She has no cervical adenopathy.  Neurological: She is alert.  Skin: Skin is warm and dry. She is not diaphoretic.  Psychiatric: She has a normal mood and affect. Her behavior is normal.  Nursing note and vitals reviewed.    ED Treatments / Results  DIAGNOSTIC STUDIES: Oxygen Saturation is 98% on RA, normal by my interpretation.    COORDINATION OF CARE: 4:56 PM- Pt advised of plan for treatment and pt agrees. Pt will receive lab work and EKG for further evaluation. Recommended to remove herself from stressful environments to deal with her SOB symptoms. She will receive work note for today. Advised pt to follow up with her OB GYN. Will provide resources for PCP establishment.     Labs (all labs ordered are listed, but only abnormal results are displayed) Labs Reviewed  PREGNANCY, URINE    EKG  EKG Interpretation  Date/Time:  Monday January 03 2016 15:48:52 EDT Ventricular Rate:  85 PR Interval:    QRS Duration: 97 QT Interval:  361 QTC Calculation: 401 R Axis:   59 Text Interpretation:  Sinus rhythm Multiple premature complexes, vent & supraven Probable anteroseptal infarct, old Since last tracing rate slower Confirmed by Ethelda Chick  MD, SAM [phone]) on 01/03/2016 4:39:55 PM       Radiology No results found.  Procedures Procedures (including critical care time)  Medications Ordered in ED Medications - No data to display   Initial Impression / Assessment and Plan / ED Course  Harolyn Rutherford, PA-C has reviewed the triage vital signs and the nursing notes.  Pertinent labs & imaging results that were available during my care of the patient were reviewed by me and considered in my medical decision making (see chart for details).  Clinical Course    Rebecca Murillo presents with intermittent anxiety, palpitations, and feelings of shortness of breath for the last few months.  Suspect anxiety. Patient voiced resolution of symptoms throughout  her contact with me. Patient was reassured and states that she feels much better. I confirmed with the patient that she wanted to have her vaginal discharge complaint assessed by her OB/GYN and does not want a pelvic exam here in the ED. I see no reason why this is unreasonable. Patient to follow up with PCP for her anxiety symptoms. Resources given. The patient was given instructions for home care as well as return precautions. Patient voices understanding of these instructions, accepts the plan, and is comfortable with discharge.  Vitals:   01/03/16 1535 01/03/16 1630  BP: 121/81 131/81  Pulse: 74 (!) 58  Resp: 20 20  Temp: 98 F (36.7 C)   TempSrc: Oral   SpO2: 98% 97%  Weight: 90.7 kg   Height:  (1.753 m)      Final Clinical Impressions(s) / ED Diagnoses   Final diagnoses:  Anxiety  I personally performed the services described in this documentation, which was scribed in my presence. The recorded information has been reviewed and is accurate.    New Prescriptions There are no discharge medications for this patient.    Anselm Pancoast, PA-C 01/05/16 1549    Doug Sou, MD 01/06/16 Jacinta Shoe
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
## 2016-01-03 NOTE — ED Triage Notes (Signed)
C/o SOB, upper back pain since Saturday-also c/o vaginal d/c-NAD-steady gait

## 2016-08-18 ENCOUNTER — Emergency Department (HOSPITAL_BASED_OUTPATIENT_CLINIC_OR_DEPARTMENT_OTHER)
Admission: EM | Admit: 2016-08-18 | Discharge: 2016-08-18 | Disposition: A | Discharge status: Home / Self Care | Payer: Medicaid Other | Attending: Emergency Medicine | Admitting: Emergency Medicine

## 2016-08-18 ENCOUNTER — Emergency Department (HOSPITAL_BASED_OUTPATIENT_CLINIC_OR_DEPARTMENT_OTHER): Payer: Medicaid Other

## 2016-08-18 ENCOUNTER — Encounter (HOSPITAL_BASED_OUTPATIENT_CLINIC_OR_DEPARTMENT_OTHER): Payer: Self-pay | Admitting: Emergency Medicine

## 2016-08-18 DIAGNOSIS — R1084 Generalized abdominal pain: Secondary | ICD-10-CM | POA: Diagnosis present

## 2016-08-18 DIAGNOSIS — N939 Abnormal uterine and vaginal bleeding, unspecified: Secondary | ICD-10-CM | POA: Insufficient documentation

## 2016-08-18 DIAGNOSIS — K5900 Constipation, unspecified: Secondary | ICD-10-CM | POA: Diagnosis not present

## 2016-08-18 DIAGNOSIS — F17210 Nicotine dependence, cigarettes, uncomplicated: Secondary | ICD-10-CM | POA: Insufficient documentation

## 2016-08-18 LAB — CBC WITH DIFFERENTIAL/PLATELET
BASOS ABS: 0 10*3/uL (ref 0.0–0.1)
BASOS PCT: 0 %
EOS ABS: 0.1 10*3/uL (ref 0.0–0.7)
EOS PCT: 2 %
HCT: 39.5 % (ref 36.0–46.0)
Hemoglobin: 13.1 g/dL (ref 12.0–15.0)
Lymphocytes Relative: 31 %
Lymphs Abs: 1.7 10*3/uL (ref 0.7–4.0)
MCH: 31.8 pg (ref 26.0–34.0)
MCHC: 33.2 g/dL (ref 30.0–36.0)
MCV: 95.9 fL (ref 78.0–100.0)
MONO ABS: 0.7 10*3/uL (ref 0.1–1.0)
Monocytes Relative: 13 %
NEUTROS ABS: 2.9 10*3/uL (ref 1.7–7.7)
Neutrophils Relative %: 54 %
Platelets: 188 10*3/uL (ref 150–400)
RBC: 4.12 MIL/uL (ref 3.87–5.11)
RDW: 13.7 % (ref 11.5–15.5)
WBC: 5.4 10*3/uL (ref 4.0–10.5)

## 2016-08-18 LAB — BASIC METABOLIC PANEL
ANION GAP: 6 (ref 5–15)
BUN: 14 mg/dL (ref 6–20)
CO2: 23 mmol/L (ref 22–32)
Calcium: 9.1 mg/dL (ref 8.9–10.3)
Chloride: 109 mmol/L (ref 101–111)
Creatinine, Ser: 0.94 mg/dL (ref 0.44–1.00)
GFR calc Af Amer: >60 mL/min (ref >60)
GLUCOSE: 92 mg/dL (ref 65–99)
Potassium: 3.7 mmol/L (ref 3.5–5.1)
Sodium: 138 mmol/L (ref 135–145)

## 2016-08-18 LAB — URINALYSIS, ROUTINE W REFLEX MICROSCOPIC
Bilirubin Urine: NEGATIVE
Glucose, UA: NEGATIVE mg/dL
Ketones, ur: NEGATIVE mg/dL
NITRITE: NEGATIVE
Protein, ur: NEGATIVE mg/dL
Specific Gravity, Urine: 1.02 (ref 1.005–1.030)
pH: 5.5 (ref 5.0–8.0)

## 2016-08-18 LAB — URINALYSIS, MICROSCOPIC (REFLEX)

## 2016-08-18 LAB — PREGNANCY, URINE: Preg Test, Ur: NEGATIVE

## 2016-08-18 NOTE — ED Provider Notes (Signed)
[facility] Provider [facility]   CSN: [id number] Arrival date & time: 08/18/16  2049 By signing my name below, I, Rebecca Murillo, attest that this documentation has been prepared under the direction and in the presence of Geoffery Lyons, MD . Electronically Signed: Levon Murillo, Scribe. 08/18/2016. 9:37 PM.   History   Chief Complaint No chief complaint on file.  HPI Rebecca Murillo is a 36 y.o. female with no pertinent medical history who presents to the Emergency Department with constipation onset 2 days ago. She has attempted OTC remedies with no relief of constipation. No alleviating or modifying factors noted. She reports associated abdominal pain, abdominal distention, nausea and vomiting. She also reports headache, back pain, abnormal vaginal bleeding, and fatigue. The patient is currently on no regular medications. She is not currently followed by a PCP.  The history is provided by the patient. No language interpreter was used.   Past Medical History:  Diagnosis Date  . Heart palpitations   . Pre-eclampsia in second trimester    There are no active problems to display for this patient.   Past Surgical History:  Procedure Laterality Date  . CESAREAN SECTION     OB History    Gravida Para Term Preterm AB Living   3 2           SAB TAB Ectopic Multiple Live Births                 Home Medications    Prior to Admission medications   Not on File    Family History History reviewed. No pertinent family history.  Social History Social History  Substance Use Topics  . Smoking status: Current Every Day Smoker    Packs/day: 0.00    Types: Cigarettes  . Smokeless tobacco: Never Used  . Alcohol use Yes     Comment: occ    Allergies   Nitrofurantoin monohyd macro  Review of Systems Review of Systems  Constitutional: Positive for fatigue.  Gastrointestinal: Positive for abdominal distention, abdominal pain, constipation, nausea and vomiting.    Genitourinary: Positive for vaginal bleeding.  Musculoskeletal: Positive for back pain.  Neurological: Positive for headaches.  All other systems reviewed and are negative.  Physical Exam Updated Vital Signs BP (!) 133/96 (BP Location: Right Arm)   Pulse (!) 107   Temp 98.5 F (36.9 C) (Oral)   Resp 18   Ht 5\' 8"  (1.727 m)   Wt 200 lb (90.7 kg)   SpO2 100%   BMI 30.41 kg/m   Physical Exam  Constitutional: She is oriented to person, place, and time. She appears well-developed and well-nourished. No distress.  HENT:  Head: Normocephalic and atraumatic.  Eyes: EOM are normal.  Neck: Normal range of motion.  Cardiovascular: Normal rate, regular rhythm and normal heart sounds.   Pulmonary/Chest: Effort normal and breath sounds normal.  Abdominal: Soft. She exhibits no distension. There is no tenderness.  Musculoskeletal: Normal range of motion.  Neurological: She is alert and oriented to person, place, and time.  Skin: Skin is warm and dry.  Psychiatric: She has a normal mood and affect. Judgment normal.  Nursing note and vitals reviewed.  ED Treatments / Results  DIAGNOSTIC STUDIES:  Oxygen Saturation is 100% on RA, normal by my interpretation.    COORDINATION OF CARE:  9:40 PM Will order DG chest, BMP, CBC, UA and pregnancy test. Discussed treatment plan with pt at bedside and pt agreed to plan.   Labs (all labs  ordered are listed, but only abnormal results are displayed) Labs Reviewed - No data to display  EKG  EKG Interpretation None       Radiology No results found.  Procedures Procedures (including critical care time)  Medications Ordered in ED Medications - No data to display   Initial Impression / Assessment and Plan / ED Course  I have reviewed the triage vital signs and the nursing notes.  Pertinent labs & imaging results that were available during my care of the patient were reviewed by me and considered in my medical decision making (see  chart for details).  Patient presents here with multiple complaints, none of which appear related and none of which appear emergent. Her workup reveals unremarkable laboratory studies, normal urinalysis, negative pregnancy test. Her physical examination is unremarkable. Her abdomen is benign and acute abdominal series reveals no obstructive pattern and no other obvious findings.  She will be advised to follow-up with her primary Dr.  Final Clinical Impressions(s) / ED Diagnoses   Final diagnoses:  None   New Prescriptions New Prescriptions   No medications on file  I personally performed the services described in this documentation, which was scribed in my presence. The recorded information has been reviewed and is accurate.        Geoffery Lyonsouglas Wanetta Funderburke, MD 08/18/16 [phone]

## 2016-08-18 NOTE — Discharge Instructions (Signed)
Follow-up with your primary Dr. if your symptoms are not improving in the next week. °

## 2016-08-18 NOTE — ED Triage Notes (Addendum)
Patient states that she has not had a BM in 3 days. Reports that she has attempted to take some over the counter remedies. Patient states that she has had abomoral cells with her last pap smear. The patient reports that she is nauseated and also states that she is having pain to her stomach, and her stomach is bloated. SHe reports that she is having fatigue and headaches as well. She would like all of this checked out while she is here

## 2016-08-18 NOTE — ED Notes (Signed)
Pt stated that she is unable to give a UA at this time.

## 2016-09-20 ENCOUNTER — Encounter (HOSPITAL_BASED_OUTPATIENT_CLINIC_OR_DEPARTMENT_OTHER): Payer: Self-pay | Admitting: Emergency Medicine

## 2016-09-20 ENCOUNTER — Emergency Department (HOSPITAL_BASED_OUTPATIENT_CLINIC_OR_DEPARTMENT_OTHER)
Admission: EM | Admit: 2016-09-20 | Discharge: 2016-09-20 | Disposition: A | Discharge status: Home / Self Care | Payer: Medicaid Other | Attending: Emergency Medicine | Admitting: Emergency Medicine

## 2016-09-20 DIAGNOSIS — N760 Acute vaginitis: Secondary | ICD-10-CM | POA: Diagnosis not present

## 2016-09-20 DIAGNOSIS — F17210 Nicotine dependence, cigarettes, uncomplicated: Secondary | ICD-10-CM | POA: Insufficient documentation

## 2016-09-20 DIAGNOSIS — B9689 Other specified bacterial agents as the cause of diseases classified elsewhere: Secondary | ICD-10-CM

## 2016-09-20 DIAGNOSIS — R42 Dizziness and giddiness: Secondary | ICD-10-CM | POA: Diagnosis present

## 2016-09-20 DIAGNOSIS — N939 Abnormal uterine and vaginal bleeding, unspecified: Secondary | ICD-10-CM

## 2016-09-20 LAB — CBC WITH DIFFERENTIAL/PLATELET
BASOS ABS: 0 10*3/uL (ref 0.0–0.1)
BASOS PCT: 0 %
EOS ABS: 0.1 10*3/uL (ref 0.0–0.7)
EOS PCT: 2 %
HEMATOCRIT: 37.9 % (ref 36.0–46.0)
Hemoglobin: 12.9 g/dL (ref 12.0–15.0)
Lymphocytes Relative: 38 %
Lymphs Abs: 2.2 10*3/uL (ref 0.7–4.0)
MCH: 32.3 pg (ref 26.0–34.0)
MCHC: 34 g/dL (ref 30.0–36.0)
MCV: 95 fL (ref 78.0–100.0)
MONOS PCT: 9 %
Monocytes Absolute: 0.6 10*3/uL (ref 0.1–1.0)
NEUTROS PCT: 51 %
Neutro Abs: 3 10*3/uL (ref 1.7–7.7)
PLATELETS: 166 10*3/uL (ref 150–400)
RBC: 3.99 MIL/uL (ref 3.87–5.11)
RDW: 13.3 % (ref 11.5–15.5)
WBC: 5.9 10*3/uL (ref 4.0–10.5)

## 2016-09-20 LAB — COMPREHENSIVE METABOLIC PANEL
ALT: 16 U/L (ref 14–54)
AST: 19 U/L (ref 15–41)
Albumin: 3.6 g/dL (ref 3.5–5.0)
Alkaline Phosphatase: 45 U/L (ref 38–126)
Anion gap: 10 (ref 5–15)
BUN: 9 mg/dL (ref 6–20)
CO2: 21 mmol/L — ABNORMAL LOW (ref 22–32)
CREATININE: 0.89 mg/dL (ref 0.44–1.00)
Calcium: 8.6 mg/dL — ABNORMAL LOW (ref 8.9–10.3)
Chloride: 107 mmol/L (ref 101–111)
GFR calc non Af Amer: >60 mL/min (ref >60)
Glucose, Bld: 102 mg/dL — ABNORMAL HIGH (ref 65–99)
Potassium: 3.6 mmol/L (ref 3.5–5.1)
Sodium: 138 mmol/L (ref 135–145)
Total Bilirubin: 0.3 mg/dL (ref 0.3–1.2)
Total Protein: 7 g/dL (ref 6.5–8.1)

## 2016-09-20 LAB — URINALYSIS, MICROSCOPIC (REFLEX)

## 2016-09-20 LAB — URINALYSIS, ROUTINE W REFLEX MICROSCOPIC
BILIRUBIN URINE: NEGATIVE
GLUCOSE, UA: NEGATIVE mg/dL
KETONES UR: NEGATIVE mg/dL
LEUKOCYTES UA: NEGATIVE
Nitrite: NEGATIVE
PROTEIN: NEGATIVE mg/dL
Specific Gravity, Urine: 1.026 (ref 1.005–1.030)
pH: 5.5 (ref 5.0–8.0)

## 2016-09-20 LAB — WET PREP, GENITAL
Sperm: NONE SEEN
Trich, Wet Prep: NONE SEEN
Yeast Wet Prep HPF POC: NONE SEEN

## 2016-09-20 LAB — PREGNANCY, URINE: PREG TEST UR: NEGATIVE

## 2016-09-20 MED ORDER — NORGESTIMATE-ETH ESTRADIOL 0.25-35 MG-MCG PO TABS: 1.0000 | ORAL_TABLET | Freq: Every day | ORAL | 11 refills | Status: AC

## 2016-09-20 MED ORDER — METRONIDAZOLE 500 MG PO TABS: 500.0000 mg | ORAL_TABLET | Freq: Two times a day (BID) | ORAL | 0 refills | Status: AC

## 2016-09-20 MED ORDER — SODIUM CHLORIDE 0.9 % IV BOLUS (SEPSIS)
500.0000 mL | Freq: Once | INTRAVENOUS | Status: AC
  Administered 2016-09-20: 500 mL via INTRAVENOUS

## 2016-09-20 MED FILL — MONO-LINYAH 28 TABLET: 0.25-35 | 28 days supply | Qty: 28 | Fill #0

## 2016-09-20 MED FILL — metroNIDAZOLE 500 MG TABS: 500 | 7 days supply | Qty: 14 | Fill #0

## 2016-09-20 NOTE — Discharge Instructions (Signed)
You have received a prescription for a medication called Sprintec. This is an oral contraceptive. The first prescription is for one package. This one package he should take 3 pills a day for one week then throw the rest away. This should help with your uterine bleeding. It should cease during this time. After this week take one week off and during this time and will likely have a regular period. This may cause a little bit of nausea when you're taking 3 a day if so you may back down to twice a day. Please follow-up with her gynecologist in 1-2 weeks for further evaluation and management of your vaginal bleeding.  Return to the ED immediately with any chest pain, difficulty breathing, or fainting.

## 2016-09-20 NOTE — ED Triage Notes (Signed)
Vaginal bleeding since Sunday.  On her period but it is heavier than normal

## 2016-09-20 NOTE — ED Provider Notes (Signed)
Emergency Department Provider Note   I have reviewed the triage vital signs and the nursing notes.   HISTORY  Chief Complaint Vaginal Bleeding   HPI Rebecca Murillo is a 35 y.o. female with PMH of heart palpitations presents to the emergency department for evaluation of heavy vaginal bleeding with worsening generalized weakness, lightheadedness, and muscle cramping. Patient states that she's had 4 days of a very heavy menstrual period. She is taken Midol with only temporary relief in symptoms but no change in bleeding. She reports soaking through 1 large tampon every 45 minutes. She states that her bleeding seemed to be decreasing yesterday but then today has continued to be heavy. She denies any heart palpitations, chest pain, difficulty breathing. She is also reporting some muscle cramping and generalized fatigue and weakness. Also notes fever over the last 2 days with some mild cough but no runny nose, sore throat, or HA.    Past Medical History:  Diagnosis Date  . Heart palpitations   . Pre-eclampsia in second trimester     There are no active problems to display for this patient.   Past Surgical History:  Procedure Laterality Date  . CESAREAN SECTION        Allergies Nitrofurantoin monohyd macro  History reviewed. No pertinent family history.  Social History Social History  Substance Use Topics  . Smoking status: Current Every Day Smoker    Packs/day: 0.00    Types: Cigarettes  . Smokeless tobacco: Never Used  . Alcohol use Yes     Comment: occ    Review of Systems  Constitutional: No fever/chills Eyes: No visual changes. ENT: No sore throat. Cardiovascular: Denies chest pain. Respiratory: Denies shortness of breath. Gastrointestinal: No abdominal pain.  No nausea, no vomiting.  No diarrhea.  No constipation. Genitourinary: Negative for dysuria. Positive vaginal bleeding.  Musculoskeletal: Negative for back pain. Skin: Negative for  rash. Neurological: Negative for headaches, focal weakness or numbness.  10-point ROS otherwise negative.  ____________________________________________   PHYSICAL EXAM:  VITAL SIGNS: ED Triage Vitals  Enc Vitals Group     BP 09/20/16 1353 133/82     Pulse Rate 09/20/16 1353 61     Resp 09/20/16 1353 18     Temp 09/20/16 1353 98.3 F (36.8 C)     Temp Source 09/20/16 1353 Oral     SpO2 09/20/16 1353 100 %     Weight 09/20/16 1354 190 lb (86.2 kg)     Height 09/20/16 1354  (1.753 m)     Pain Score 09/20/16 1353 10   Constitutional: Alert and oriented. Well appearing and in no acute distress. Eyes: Conjunctivae are normal. Head: Atraumatic. Nose: No congestion/rhinnorhea. Mouth/Throat: Mucous membranes are moist.  Neck: No stridor.   Cardiovascular: Normal rate, regular rhythm. Good peripheral circulation. Grossly normal heart sounds.   Respiratory: Normal respiratory effort.  No retractions. Lungs CTAB. Gastrointestinal: Soft and nontender. No distention.  Genitourinary: Scant dark blood in the vaginal vault. No CMT. Mild adnexal tenderness bilaterally.  Musculoskeletal: No lower extremity tenderness nor edema. No gross deformities of extremities. Neurologic:  Normal speech and language. No gross focal neurologic deficits are appreciated.  Skin:  Skin is warm, dry and intact. No rash noted.  ____________________________________________   LABS (all labs ordered are listed, but only abnormal results are displayed)  Labs Reviewed  WET PREP, GENITAL - Abnormal; Notable for the following:       Result Value   Clue Cells Wet Prep HPF  POC PRESENT (*)    WBC, Wet Prep HPF POC MANY (*)    All other components within normal limits  COMPREHENSIVE METABOLIC PANEL - Abnormal; Notable for the following:    CO2 21 (*)    Glucose, Bld 102 (*)    Calcium 8.6 (*)    All other components within normal limits  URINALYSIS, ROUTINE W REFLEX MICROSCOPIC - Abnormal; Notable for the  following:    APPearance CLOUDY (*)    Hgb urine dipstick LARGE (*)    All other components within normal limits  URINALYSIS, MICROSCOPIC (REFLEX) - Abnormal; Notable for the following:    Bacteria, UA FEW (*)    Squamous Epithelial / LPF 0-5 (*)    All other components within normal limits  CBC WITH DIFFERENTIAL/PLATELET  PREGNANCY, URINE  GC/CHLAMYDIA PROBE AMP ([place]) NOT AT [facility]   ____________________________________________   PROCEDURES  Procedure(s) performed:   Procedures  None ____________________________________________   INITIAL IMPRESSION / ASSESSMENT AND PLAN / ED COURSE  Pertinent labs & imaging results that were available during my care of the patient were reviewed by me and considered in my medical decision making (see chart for details).  Patient presents to the emergency department for evaluation of heavy vaginal bleeding and generalized weakness. Had a fever yesterday with some mild cough but no other infection source. Pelvic exam is non-specific with only mild discomfort and bleeding. Plan for labs, IVF, and reassessment.   Evidence of BV on wet prep. Starting Flagyl and birth control burst for vaginal bleeding control. Discussed increased risk of DVT/PE and return precautions in detail.   At this time, I do not feel there is any life-threatening condition present. I have reviewed and discussed all results (EKG, imaging, lab, urine as appropriate), exam findings with patient. I have reviewed nursing notes and appropriate previous records.  I feel the patient is safe to be discharged home without further emergent workup. Discussed usual and customary return precautions. Patient and family (if present) verbalize understanding and are comfortable with this plan.  Patient will follow-up with their primary care provider. If they do not have a primary care provider, information for follow-up has been provided to them. All questions have been  answered.  ____________________________________________  FINAL CLINICAL IMPRESSION(S) / ED DIAGNOSES  Final diagnoses:  Vaginal bleeding  BV (bacterial vaginosis)     MEDICATIONS GIVEN DURING THIS VISIT:  Medications  sodium chloride 0.9 % bolus 500 mL (0 mLs Intravenous Stopped 09/20/16 1540)     NEW OUTPATIENT MEDICATIONS STARTED DURING THIS VISIT:  Discharge Medication List as of 09/20/2016  3:55 PM    START taking these medications   Details  metroNIDAZOLE (FLAGYL) 500 MG tablet Take 1 tablet (500 mg total) by mouth 2 (two) times daily., Starting Wed 09/20/2016, Print    norgestimate-ethinyl estradiol (SPRINTEC 28) 0.25-35 MG-MCG tablet Take 1 tablet by mouth daily., Starting Wed 09/20/2016, Print          Note:  This document was prepared using Dragon voice recognition software and may include unintentional dictation errors.  Alona Bene, MD Emergency Medicine   Maia Plan, MD 09/20/16 Mikle Bosworth

## 2016-09-21 LAB — GC/CHLAMYDIA PROBE AMP (~~LOC~~) NOT AT ARMC
Chlamydia: POSITIVE — AB
Neisseria Gonorrhea: NEGATIVE

## 2017-10-17 ENCOUNTER — Encounter (HOSPITAL_BASED_OUTPATIENT_CLINIC_OR_DEPARTMENT_OTHER): Payer: Self-pay | Admitting: Emergency Medicine

## 2017-10-17 ENCOUNTER — Emergency Department (HOSPITAL_BASED_OUTPATIENT_CLINIC_OR_DEPARTMENT_OTHER): Payer: Self-pay

## 2017-10-17 ENCOUNTER — Emergency Department (HOSPITAL_BASED_OUTPATIENT_CLINIC_OR_DEPARTMENT_OTHER)
Admission: EM | Admit: 2017-10-17 | Discharge: 2017-10-17 | Disposition: A | Discharge status: Home / Self Care | Payer: Self-pay | Attending: Emergency Medicine | Admitting: Emergency Medicine

## 2017-10-17 ENCOUNTER — Other Ambulatory Visit: Payer: Self-pay

## 2017-10-17 DIAGNOSIS — Y929 Unspecified place or not applicable: Secondary | ICD-10-CM | POA: Insufficient documentation

## 2017-10-17 DIAGNOSIS — Y939 Activity, unspecified: Secondary | ICD-10-CM | POA: Insufficient documentation

## 2017-10-17 DIAGNOSIS — W1849XA Other slipping, tripping and stumbling without falling, initial encounter: Secondary | ICD-10-CM | POA: Insufficient documentation

## 2017-10-17 DIAGNOSIS — F17210 Nicotine dependence, cigarettes, uncomplicated: Secondary | ICD-10-CM | POA: Insufficient documentation

## 2017-10-17 DIAGNOSIS — M25572 Pain in left ankle and joints of left foot: Secondary | ICD-10-CM

## 2017-10-17 DIAGNOSIS — Y999 Unspecified external cause status: Secondary | ICD-10-CM | POA: Insufficient documentation

## 2017-10-17 DIAGNOSIS — S93402A Sprain of unspecified ligament of left ankle, initial encounter: Secondary | ICD-10-CM | POA: Insufficient documentation

## 2017-10-17 MED ORDER — IBUPROFEN 400 MG PO TABS
600.0000 mg | ORAL_TABLET | Freq: Once | ORAL | Status: AC
  Administered 2017-10-17: 11:00:00 600 mg via ORAL
  Filled 2017-10-17: qty 1
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
## 2017-10-17 NOTE — Discharge Instructions (Addendum)
Wear ankle brace for at least 2 weeks for stabilization of ankle. Use crutches as needed for comfort. Ice and elevate ankle throughout the day, using ice pack for no more than 20 minutes every hour.  Alternate between tylenol and ibuprofen as needed for pain relief. Call the sports medicine specialist follow up today or tomorrow to schedule followup appointment for recheck of ongoing ankle pain in 1-2 weeks. Return to the ER for changes or worsening symptoms.
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
## 2017-10-17 NOTE — ED Notes (Signed)
ED Provider at bedside. 

## 2018-01-08 ENCOUNTER — Emergency Department (HOSPITAL_BASED_OUTPATIENT_CLINIC_OR_DEPARTMENT_OTHER): Payer: Self-pay

## 2018-01-08 ENCOUNTER — Emergency Department (HOSPITAL_BASED_OUTPATIENT_CLINIC_OR_DEPARTMENT_OTHER)
Admission: EM | Admit: 2018-01-08 | Discharge: 2018-01-08 | Disposition: A | Discharge status: Home / Self Care | Payer: Self-pay | Attending: Emergency Medicine | Admitting: Emergency Medicine

## 2018-01-08 ENCOUNTER — Other Ambulatory Visit: Payer: Self-pay

## 2018-01-08 ENCOUNTER — Encounter (HOSPITAL_BASED_OUTPATIENT_CLINIC_OR_DEPARTMENT_OTHER): Payer: Self-pay | Admitting: *Deleted

## 2018-01-08 DIAGNOSIS — R0789 Other chest pain: Secondary | ICD-10-CM | POA: Insufficient documentation

## 2018-01-08 DIAGNOSIS — M5489 Other dorsalgia: Secondary | ICD-10-CM | POA: Insufficient documentation

## 2018-01-08 DIAGNOSIS — F17200 Nicotine dependence, unspecified, uncomplicated: Secondary | ICD-10-CM | POA: Insufficient documentation

## 2018-01-08 DIAGNOSIS — N739 Female pelvic inflammatory disease, unspecified: Secondary | ICD-10-CM | POA: Insufficient documentation

## 2018-01-08 DIAGNOSIS — N730 Acute parametritis and pelvic cellulitis: Secondary | ICD-10-CM

## 2018-01-08 DIAGNOSIS — M549 Dorsalgia, unspecified: Secondary | ICD-10-CM

## 2018-01-08 LAB — CBC
HCT: 41.3 % (ref 36.0–46.0)
Hemoglobin: 14.1 g/dL (ref 12.0–15.0)
MCH: 31.2 pg (ref 26.0–34.0)
MCHC: 34.1 g/dL (ref 30.0–36.0)
MCV: 91.4 fL (ref 78.0–100.0)
PLATELETS: 158 10*3/uL (ref 150–400)
RBC: 4.52 MIL/uL (ref 3.87–5.11)
RDW: 14.4 % (ref 11.5–15.5)
WBC: 6.2 10*3/uL (ref 4.0–10.5)

## 2018-01-08 LAB — BASIC METABOLIC PANEL
Anion gap: 8 (ref 5–15)
BUN: 10 mg/dL (ref 6–20)
CO2: 22 mmol/L (ref 22–32)
CREATININE: 1.04 mg/dL — AB (ref 0.44–1.00)
Calcium: 8.1 mg/dL — ABNORMAL LOW (ref 8.9–10.3)
Chloride: 106 mmol/L (ref 98–111)
Glucose, Bld: 92 mg/dL (ref 70–99)
POTASSIUM: 3.5 mmol/L (ref 3.5–5.1)
SODIUM: 136 mmol/L (ref 135–145)

## 2018-01-08 LAB — URINALYSIS, ROUTINE W REFLEX MICROSCOPIC
BILIRUBIN URINE: NEGATIVE
Glucose, UA: NEGATIVE mg/dL
Hgb urine dipstick: NEGATIVE
Ketones, ur: NEGATIVE mg/dL
LEUKOCYTES UA: NEGATIVE
NITRITE: NEGATIVE
Protein, ur: NEGATIVE mg/dL
Specific Gravity, Urine: 1.015 (ref 1.005–1.030)
pH: 8 (ref 5.0–8.0)

## 2018-01-08 LAB — PREGNANCY, URINE: PREG TEST UR: NEGATIVE

## 2018-01-08 LAB — TROPONIN I: Troponin I: <0.03 ng/mL (ref <0.03)

## 2018-01-08 LAB — WET PREP, GENITAL
TRICH WET PREP: NONE SEEN
YEAST WET PREP: NONE SEEN

## 2018-01-08 MED ORDER — AZITHROMYCIN 250 MG PO TABS
1000.0000 mg | ORAL_TABLET | Freq: Once | ORAL | Status: AC
  Administered 2018-01-08: 1000 mg via ORAL
  Filled 2018-01-08: qty 4

## 2018-01-08 MED ORDER — CEFTRIAXONE SODIUM 250 MG IJ SOLR
250.0000 mg | Freq: Once | INTRAMUSCULAR | Status: AC
  Administered 2018-01-08: 250 mg via INTRAMUSCULAR
  Filled 2018-01-08: qty 250

## 2018-01-08 MED ORDER — IBUPROFEN 800 MG PO TABS: 800.0000 mg | ORAL_TABLET | Freq: Three times a day (TID) | ORAL | 0 refills | Status: AC | PRN

## 2018-01-08 MED ORDER — METRONIDAZOLE 500 MG PO TABS
2000.0000 mg | ORAL_TABLET | Freq: Once | ORAL | Status: AC
  Administered 2018-01-08: 2000 mg via ORAL
  Filled 2018-01-08: qty 4

## 2018-01-08 MED ORDER — ONDANSETRON 8 MG PO TBDP
8.0000 mg | ORAL_TABLET | Freq: Once | ORAL | Status: AC
  Administered 2018-01-08: 8 mg via ORAL
  Filled 2018-01-08: qty 1

## 2018-01-08 MED ORDER — DOXYCYCLINE HYCLATE 100 MG PO CAPS: 100.0000 mg | ORAL_CAPSULE | Freq: Two times a day (BID) | ORAL | 0 refills | Status: AC

## 2018-01-08 NOTE — ED Triage Notes (Addendum)
Mid back pain x 2 days. Abdominal pain. She thinks she has a UTI. He has a vaginal discharge with an odor.

## 2018-01-08 NOTE — ED Notes (Signed)
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  Pt. Reports muscle spasms in her back.

## 2018-01-08 NOTE — ED Provider Notes (Signed)
[facility] EMERGENCY DEPARTMENT Provider Note   CSN: [id number] Arrival date & time: 01/08/18  1941     History   Chief Complaint No chief complaint on file.   HPI Rebecca Murillo is a 37 y.o. female.  She presents to the emergency department with various complaints.  She states she has had mid and lower back pain for the last couple of days.  She feels stiff like a normal person when she tries to get up out of a chair.  She works a job doing heavy lifting but does not feel at the present injury.  She tried some Tylenol but it did not seem to change her pain.  She also has felt pain in her chest for the last couple of days.  There is been no cough no shortness of breath.  She can reproduce the pain with palpation.  She also has some burning and itching in her vaginal area and thought she might have a UTI.  She is had vaginal discharge.  No nausea no vomiting no diarrhea.  No fevers no chills.  The history is provided by the patient.  Chest Pain   This is a new problem. The current episode started yesterday. The problem occurs constantly. The problem has not changed since onset.The pain is associated with movement. The pain is present in the substernal region. The pain is moderate. Quality: soreness. The pain does not radiate. Associated symptoms include back pain. Pertinent negatives include no abdominal pain, no fever, no headaches, no hemoptysis, no leg pain, no lower extremity edema, no nausea, no shortness of breath, no sputum production and no vomiting. Treatments tried: tylenol. The treatment provided no relief. Risk factors include smoking/tobacco exposure.    Past Medical History:  Diagnosis Date  . Heart palpitations   . Pre-eclampsia in second trimester     There are no active problems to display for this patient.   Past Surgical History:  Procedure Laterality Date  . CESAREAN SECTION       OB History    Gravida  3   Para  2   Term      Preterm        AB      Living        SAB      TAB      Ectopic      Multiple      Live Births               Home Medications    Prior to Admission medications   Medication Sig Start Date End Date Taking? Authorizing Provider  metroNIDAZOLE (FLAGYL) 500 MG tablet Take 1 tablet (500 mg total) by mouth 2 (two) times daily. 09/20/16   Long, Arlyss RepressJoshua G, MD  norgestimate-ethinyl estradiol (SPRINTEC 28) 0.25-35 MG-MCG tablet Take 1 tablet by mouth daily. 09/20/16   Long, Arlyss RepressJoshua G, MD    Family History No family history on file.  Social History Social History   Tobacco Use  . Smoking status: Current Every Day Smoker    Packs/day: 0.00    Types: Cigarettes  . Smokeless tobacco: Never Used  Substance Use Topics  . Alcohol use: Yes    Comment: occ  . Drug use: No     Allergies   Nitrofurantoin monohyd macro   Review of Systems Review of Systems  Constitutional: Negative for fever.  HENT: Negative for sore throat.   Eyes: Negative for visual disturbance.  Respiratory: Negative for hemoptysis,  sputum production and shortness of breath.   Cardiovascular: Positive for chest pain.  Gastrointestinal: Negative for abdominal pain, nausea and vomiting.  Genitourinary: Positive for frequency and vaginal discharge.  Musculoskeletal: Positive for back pain.  Skin: Negative for rash.  Neurological: Negative for headaches.     Physical Exam Updated Vital Signs BP (!) 111/59 (BP Location: Left Arm)   Pulse 72   Temp 98 F (36.7 C) (Oral)   Resp 16   Ht 5\' 9"  (1.753 m)   Wt 92.1 kg   LMP 12/09/2017   SpO2 100%   BMI 29.98 kg/m   Physical Exam  Constitutional: She appears well-developed and well-nourished. No distress.  HENT:  Head: Normocephalic and atraumatic.  Eyes: Conjunctivae are normal.  Neck: Neck supple.  Cardiovascular: Normal rate, regular rhythm and normal heart sounds.  No murmur heard. Pulmonary/Chest: Effort normal and breath sounds normal. No respiratory  distress. She exhibits tenderness (diffuse). She exhibits no crepitus.  Abdominal: Soft. There is no tenderness.  Genitourinary: There is no rash on the right labia. There is no rash on the left labia. Uterus is not enlarged. Cervix exhibits motion tenderness and discharge. Right adnexum displays tenderness. Right adnexum displays no mass. Left adnexum displays tenderness. Left adnexum displays no mass. No erythema or bleeding in the vagina. No foreign body in the vagina. Vaginal discharge found.  Genitourinary Comments: Patient with some white-greenish discharge that is thick inside the vault and from the cervix. Chaperone was present during exam.   Musculoskeletal: She exhibits no edema or deformity.  She has diffuse parathoracic and paralumbar tenderness.  There is no obvious step-offs.  No ecchymosis.  Neurological: She is alert. She has normal strength. No sensory deficit. Gait normal. GCS eye subscore is 4. GCS verbal subscore is 5. GCS motor subscore is 6.  Skin: Skin is warm and dry.  Psychiatric: She has a normal mood and affect.  Nursing note and vitals reviewed.    ED Treatments / Results  Labs (all labs ordered are listed, but only abnormal results are displayed) Labs Reviewed  WET PREP, GENITAL - Abnormal; Notable for the following components:      Result Value   Clue Cells Wet Prep HPF POC PRESENT (*)    WBC, Wet Prep HPF POC MANY (*)    All other components within normal limits  BASIC METABOLIC PANEL - Abnormal; Notable for the following components:   Creatinine, Ser 1.04 (*)    Calcium 8.1 (*)    All other components within normal limits  URINALYSIS, ROUTINE W REFLEX MICROSCOPIC  PREGNANCY, URINE  CBC  TROPONIN I  RPR  HIV ANTIBODY (ROUTINE TESTING)  GC/CHLAMYDIA PROBE AMP ([place]) NOT [facility]    EKG None  Radiology No results found.  Procedures Procedures (including critical care time)  Medications Ordered in ED Medications - No data to  display   Initial Impression / Assessment and Plan / ED Course  I have reviewed the triage vital signs and the nursing notes.  Pertinent labs & imaging results that were available during my care of the patient were reviewed by me and considered in my medical decision making (see chart for details).      Final Clinical Impressions(s) / ED Diagnoses   Final diagnoses:  Acute chest wall pain  Acute bilateral back pain, unspecified back location  PID (acute pelvic inflammatory disease)    ED Discharge Orders         Ordered    doxycycline (  VIBRAMYCIN) 100 MG capsule  2 times daily     01/08/18 2252    ibuprofen (ADVIL,MOTRIN) 800 MG tablet  Every 8 hours PRN     01/08/18 2252           Terrilee Files, MD 01/09/18 1155

## 2018-01-08 NOTE — Discharge Instructions (Addendum)
You were evaluated in the emergency department for chest pain back pain and vaginal discharge.  We are treating you for a possible STD although testing will not return for a few days.  You are being prescribed some ibuprofen and some doxycycline.  We will contact you if the medications need to be changed.  Please follow-up with your doctor or return if any worsening symptoms.

## 2018-01-10 LAB — RPR: RPR Ser Ql: NONREACTIVE

## 2018-01-10 LAB — HIV ANTIBODY (ROUTINE TESTING W REFLEX): HIV Screen 4th Generation wRfx: NONREACTIVE

## 2018-01-10 LAB — GC/CHLAMYDIA PROBE AMP (~~LOC~~) NOT AT ARMC
Chlamydia: NEGATIVE
Neisseria Gonorrhea: NEGATIVE

## 2020-11-25 ENCOUNTER — Other Ambulatory Visit: Payer: Self-pay

## 2020-11-25 ENCOUNTER — Emergency Department (HOSPITAL_BASED_OUTPATIENT_CLINIC_OR_DEPARTMENT_OTHER)
Admission: EM | Admit: 2020-11-25 | Discharge: 2020-11-25 | Disposition: A | Discharge status: Home / Self Care | Payer: Medicaid Other | Attending: Emergency Medicine | Admitting: Emergency Medicine

## 2020-11-25 ENCOUNTER — Encounter (HOSPITAL_BASED_OUTPATIENT_CLINIC_OR_DEPARTMENT_OTHER): Payer: Self-pay | Admitting: *Deleted

## 2020-11-25 DIAGNOSIS — R197 Diarrhea, unspecified: Secondary | ICD-10-CM | POA: Diagnosis not present

## 2020-11-25 DIAGNOSIS — R1084 Generalized abdominal pain: Secondary | ICD-10-CM | POA: Diagnosis not present

## 2020-11-25 DIAGNOSIS — R112 Nausea with vomiting, unspecified: Secondary | ICD-10-CM | POA: Diagnosis present

## 2020-11-25 DIAGNOSIS — F17210 Nicotine dependence, cigarettes, uncomplicated: Secondary | ICD-10-CM | POA: Diagnosis not present

## 2020-11-25 DIAGNOSIS — Z20822 Contact with and (suspected) exposure to covid-19: Secondary | ICD-10-CM | POA: Insufficient documentation

## 2020-11-25 LAB — URINALYSIS, ROUTINE W REFLEX MICROSCOPIC
Bilirubin Urine: NEGATIVE
Glucose, UA: NEGATIVE mg/dL
Hgb urine dipstick: NEGATIVE
Ketones, ur: NEGATIVE mg/dL
Nitrite: NEGATIVE
Protein, ur: NEGATIVE mg/dL
Specific Gravity, Urine: 1.025 (ref 1.005–1.030)
pH: 6.5 (ref 5.0–8.0)

## 2020-11-25 LAB — CBC WITH DIFFERENTIAL/PLATELET
Abs Immature Granulocytes: 0.02 10*3/uL (ref 0.00–0.07)
Basophils Absolute: 0 10*3/uL (ref 0.0–0.1)
Basophils Relative: 0 %
Eosinophils Absolute: 0.1 10*3/uL (ref 0.0–0.5)
Eosinophils Relative: 1 %
HCT: 40.2 % (ref 36.0–46.0)
Hemoglobin: 13.8 g/dL (ref 12.0–15.0)
Immature Granulocytes: 0 %
Lymphocytes Relative: 29 %
Lymphs Abs: 2 10*3/uL (ref 0.7–4.0)
MCH: 32.4 pg (ref 26.0–34.0)
MCHC: 34.3 g/dL (ref 30.0–36.0)
MCV: 94.4 fL (ref 80.0–100.0)
Monocytes Absolute: 0.6 10*3/uL (ref 0.1–1.0)
Monocytes Relative: 8 %
Neutro Abs: 4.4 10*3/uL (ref 1.7–7.7)
Neutrophils Relative %: 62 %
Platelets: 213 10*3/uL (ref 150–400)
RBC: 4.26 MIL/uL (ref 3.87–5.11)
RDW: 13.8 % (ref 11.5–15.5)
WBC: 7.1 10*3/uL (ref 4.0–10.5)
nRBC: 0 % (ref 0.0–0.2)

## 2020-11-25 LAB — COMPREHENSIVE METABOLIC PANEL
ALT: 19 U/L (ref 0–44)
AST: 25 U/L (ref 15–41)
Albumin: 3.5 g/dL (ref 3.5–5.0)
Alkaline Phosphatase: 47 U/L (ref 38–126)
Anion gap: 8 (ref 5–15)
BUN: 16 mg/dL (ref 6–20)
CO2: 21 mmol/L — ABNORMAL LOW (ref 22–32)
Calcium: 8.4 mg/dL — ABNORMAL LOW (ref 8.9–10.3)
Chloride: 107 mmol/L (ref 98–111)
Creatinine, Ser: 0.98 mg/dL (ref 0.44–1.00)
GFR, Estimated: >60 mL/min (ref >60)
Glucose, Bld: 129 mg/dL — ABNORMAL HIGH (ref 70–99)
Potassium: 3.6 mmol/L (ref 3.5–5.1)
Sodium: 136 mmol/L (ref 135–145)
Total Bilirubin: 0.3 mg/dL (ref 0.3–1.2)
Total Protein: 6.3 g/dL — ABNORMAL LOW (ref 6.5–8.1)

## 2020-11-25 LAB — PREGNANCY, URINE: Preg Test, Ur: NEGATIVE

## 2020-11-25 LAB — URINALYSIS, MICROSCOPIC (REFLEX)

## 2020-11-25 LAB — RESP PANEL BY RT-PCR (FLU A&B, COVID) ARPGX2
Influenza A by PCR: NEGATIVE
Influenza B by PCR: NEGATIVE
SARS Coronavirus 2 by RT PCR: NEGATIVE

## 2020-11-25 LAB — LIPASE, BLOOD: Lipase: 51 U/L (ref 11–51)

## 2020-11-25 MED ORDER — ONDANSETRON HCL 4 MG/2ML IJ SOLN
4.0000 mg | Freq: Once | INTRAMUSCULAR | Status: AC
  Administered 2020-11-25: 4 mg via INTRAVENOUS
  Filled 2020-11-25: qty 2

## 2020-11-25 MED ORDER — SODIUM CHLORIDE 0.9 % IV BOLUS
1000.0000 mL | Freq: Once | INTRAVENOUS | Status: AC
  Administered 2020-11-25: 1000 mL via INTRAVENOUS

## 2020-11-25 MED ORDER — ONDANSETRON HCL 4 MG PO TABS: 4.0000 mg | ORAL_TABLET | Freq: Four times a day (QID) | ORAL | 0 refills | Status: AC

## 2020-11-25 NOTE — ED Provider Notes (Signed)
[facility] EMERGENCY DEPARTMENT Provider Note   CSN: [id number] Arrival date & time: 11/25/20  1832     History Chief Complaint  Patient presents with   Abdominal Pain    Rebecca Murillo is a 40 y.o. female.  The history is provided by the patient.  Abdominal Pain Pain location:  Generalized Pain quality: aching   Pain radiates to:  Does not radiate Pain severity:  Mild Onset quality:  Gradual Duration:  4 days Timing:  Intermittent Progression:  Waxing and waning Chronicity:  New Context: sick contacts (with the same symptoms, likely exposed to covid at work)   Relieved by:  Nothing Worsened by:  Nothing Associated symptoms: diarrhea, nausea and vomiting   Associated symptoms: no chest pain, no chills, no cough, no dysuria, no fever, no hematuria, no shortness of breath and no sore throat       Past Medical History:  Diagnosis Date   Heart palpitations    Pre-eclampsia in second trimester     There are no problems to display for this patient.   Past Surgical History:  Procedure Laterality Date   CESAREAN SECTION       OB History     Gravida  3   Para  2   Term      Preterm      AB      Living         SAB      IAB      Ectopic      Multiple      Live Births              No family history on file.  Social History   Tobacco Use   Smoking status: Every Day    Packs/day: 0.00    Pack years: 0.00    Types: Cigarettes   Smokeless tobacco: Never  Substance Use Topics   Alcohol use: Yes    Comment: occ   Drug use: No    Home Medications Prior to Admission medications   Medication Sig Start Date End Date Taking? Authorizing Provider  ondansetron (ZOFRAN) 4 MG tablet Take 1 tablet (4 mg total) by mouth every 6 (six) hours. 11/25/20  Yes Rebecca Baysinger, DO  doxycycline (VIBRAMYCIN) 100 MG capsule Take 1 capsule (100 mg total) by mouth 2 (two) times daily. 01/08/18   Rebecca Files, MD  ibuprofen (ADVIL,MOTRIN)  800 MG tablet Take 1 tablet (800 mg total) by mouth every 8 (eight) hours as needed. 01/08/18   Rebecca Files, MD  metroNIDAZOLE (FLAGYL) 500 MG tablet Take 1 tablet (500 mg total) by mouth 2 (two) times daily. 09/20/16   Long, Arlyss Repress, MD  norgestimate-ethinyl estradiol (SPRINTEC 28) 0.25-35 MG-MCG tablet Take 1 tablet by mouth daily. 09/20/16   Long, Arlyss Repress, MD    Allergies    Nitrofurantoin monohyd macro  Review of Systems   Review of Systems  Constitutional:  Negative for chills and fever.  HENT:  Negative for ear pain and sore throat.   Eyes:  Negative for pain and visual disturbance.  Respiratory:  Negative for cough and shortness of breath.   Cardiovascular:  Negative for chest pain and palpitations.  Gastrointestinal:  Positive for abdominal pain, diarrhea, nausea and vomiting.  Genitourinary:  Negative for dysuria and hematuria.  Musculoskeletal:  Negative for arthralgias and back pain.  Skin:  Negative for color change and rash.  Neurological:  Negative for seizures and syncope.  All  other systems reviewed and are negative.  Physical Exam Updated Vital Signs BP 133/86 (BP Location: Left Arm)   Pulse 60   Temp 98.5 F (36.9 C) (Oral)   Resp 20   Ht 5\' 9"  (1.753 m)   Wt 95.3 kg   LMP 11/09/2020   SpO2 97%   BMI 31.01 kg/m   Physical Exam Vitals and nursing note reviewed.  Constitutional:      General: She is not in acute distress.    Appearance: She is well-developed. She is not ill-appearing.  HENT:     Head: Normocephalic and atraumatic.     Mouth/Throat:     Mouth: Mucous membranes are moist.  Eyes:     Extraocular Movements: Extraocular movements intact.     Conjunctiva/sclera: Conjunctivae normal.     Pupils: Pupils are equal, round, and reactive to light.  Cardiovascular:     Rate and Rhythm: Normal rate and regular rhythm.     Pulses: Normal pulses.     Heart sounds: Normal heart sounds. No murmur heard. Pulmonary:     Effort: Pulmonary effort  is normal. No respiratory distress.     Breath sounds: Normal breath sounds.  Abdominal:     Palpations: Abdomen is soft.     Tenderness: There is no abdominal tenderness.  Musculoskeletal:     Cervical back: Neck supple.  Skin:    General: Skin is warm and dry.     Capillary Refill: Capillary refill takes less than 2 seconds.  Neurological:     General: No focal deficit present.     Mental Status: She is alert.    ED Results / Procedures / Treatments   Labs (all labs ordered are listed, but only abnormal results are displayed) Labs Reviewed  URINALYSIS, ROUTINE W REFLEX MICROSCOPIC - Abnormal; Notable for the following components:      Result Value   Leukocytes,Ua SMALL (*)    All other components within normal limits  COMPREHENSIVE METABOLIC PANEL - Abnormal; Notable for the following components:   CO2 21 (*)    Glucose, Bld 129 (*)    Calcium 8.4 (*)    Total Protein 6.3 (*)    All other components within normal limits  URINALYSIS, MICROSCOPIC (REFLEX) - Abnormal; Notable for the following components:   Bacteria, UA MANY (*)    All other components within normal limits  RESP PANEL BY RT-PCR (FLU A&B, COVID) ARPGX2  URINE CULTURE  PREGNANCY, URINE  CBC WITH DIFFERENTIAL/PLATELET  LIPASE, BLOOD    EKG None  Radiology No results found.  Procedures Procedures   Medications Ordered in ED Medications  sodium chloride 0.9 % bolus 1,000 mL (1,000 mLs Intravenous New Bag/Given 11/25/20 1859)  ondansetron (ZOFRAN) injection 4 mg (4 mg Intravenous Given 11/25/20 1901)    ED Course  I have reviewed the triage vital signs and the nursing notes.  Pertinent labs & imaging results that were available during my care of the patient were reviewed by me and considered in my medical decision making (see chart for details).    MDM Rules/Calculators/A&P                          Rebecca Murillo is here with nausea, vomiting, diarrhea.  Multiple family members at home with  the same.  COVID test negative.  No significant anemia, electrolyte issue or kidney injury.  Overall suspect viral process.  No real focal abdominal pain.  No concern  for appendicitis.  Pregnancy test negative.  Urinalysis overall clavicle.  Not having any urinary symptoms.  Suspect that this is likely contamination.  We will send urine culture.  Feeling better after IV fluids and nausea medication.  Will prescribe Zofran.  Continued hydration at home.  Discharged in good condition. Vitals normal. No fever.  This chart was dictated using voice recognition software.  Despite best efforts to proofread,  errors can occur which can change the documentation meaning.   Final Clinical Impression(s) / ED Diagnoses Final diagnoses:  Nausea vomiting and diarrhea    Rx / DC Orders ED Discharge Orders          Ordered    ondansetron (ZOFRAN) 4 MG tablet  Every 6 hours        11/25/20 2010             Virgina Norfolk, DO 11/25/20 2013

## 2020-11-25 NOTE — ED Triage Notes (Addendum)
C/o abd pain with n/v/d x 4 days, family at home with same

## 2020-11-27 LAB — URINE CULTURE: Culture: <10000 — AB

## 2021-08-07 ENCOUNTER — Other Ambulatory Visit: Payer: Self-pay

## 2021-08-07 ENCOUNTER — Emergency Department (HOSPITAL_BASED_OUTPATIENT_CLINIC_OR_DEPARTMENT_OTHER): Payer: Medicaid Other

## 2021-08-07 ENCOUNTER — Encounter (HOSPITAL_BASED_OUTPATIENT_CLINIC_OR_DEPARTMENT_OTHER): Payer: Self-pay | Admitting: Emergency Medicine

## 2021-08-07 ENCOUNTER — Emergency Department (HOSPITAL_BASED_OUTPATIENT_CLINIC_OR_DEPARTMENT_OTHER)
Admission: EM | Admit: 2021-08-07 | Discharge: 2021-08-07 | Disposition: A | Discharge status: Home / Self Care | Payer: Medicaid Other | Attending: Emergency Medicine | Admitting: Emergency Medicine

## 2021-08-07 DIAGNOSIS — G8911 Acute pain due to trauma: Secondary | ICD-10-CM | POA: Insufficient documentation

## 2021-08-07 DIAGNOSIS — M545 Low back pain, unspecified: Secondary | ICD-10-CM | POA: Insufficient documentation

## 2021-08-07 DIAGNOSIS — W010XXA Fall on same level from slipping, tripping and stumbling without subsequent striking against object, initial encounter: Secondary | ICD-10-CM | POA: Diagnosis not present

## 2021-08-07 DIAGNOSIS — W19XXXA Unspecified fall, initial encounter: Secondary | ICD-10-CM

## 2021-08-07 DIAGNOSIS — M533 Sacrococcygeal disorders, not elsewhere classified: Secondary | ICD-10-CM | POA: Diagnosis not present

## 2021-08-07 DIAGNOSIS — Y92137 Garden or yard on military base as the place of occurrence of the external cause: Secondary | ICD-10-CM | POA: Insufficient documentation

## 2021-08-07 LAB — URINALYSIS, ROUTINE W REFLEX MICROSCOPIC
Bilirubin Urine: NEGATIVE
Glucose, UA: NEGATIVE mg/dL
Hgb urine dipstick: NEGATIVE
Ketones, ur: NEGATIVE mg/dL
Leukocytes,Ua: NEGATIVE
Nitrite: NEGATIVE
Protein, ur: NEGATIVE mg/dL
Specific Gravity, Urine: 1.02 (ref 1.005–1.030)
pH: 8 (ref 5.0–8.0)

## 2021-08-07 LAB — PREGNANCY, URINE: Preg Test, Ur: NEGATIVE

## 2021-08-07 MED ORDER — KETOROLAC TROMETHAMINE 15 MG/ML IJ SOLN
15.0000 mg | Freq: Once | INTRAMUSCULAR | Status: AC
  Administered 2021-08-07: 15 mg via INTRAMUSCULAR
  Filled 2021-08-07: qty 1

## 2021-08-07 MED ORDER — IBUPROFEN 600 MG PO TABS: 600.0000 mg | ORAL_TABLET | Freq: Four times a day (QID) | ORAL | 0 refills | Status: AC | PRN

## 2021-08-07 NOTE — Discharge Instructions (Addendum)
The x-rays of your back did not show any broken bones in your spine however you do have some arthritis in your lower back. ? ?You may alternate taking Tylenol and Ibuprofen as needed for pain control. You may take 400-600 mg of ibuprofen every 6 hours and [phone] mg of Tylenol every 6 hours. Do not exceed 4000 mg of Tylenol daily as this can lead to liver damage. Also, make sure to take Ibuprofen with meals as it can cause an upset stomach. Do not take other NSAIDs while taking Ibuprofen such as (Aleve, Naprosyn, Aspirin, Celebrex, etc) and do not take more than the prescribed dose as this can lead to ulcers and bleeding in your GI tract. You may use warm and cold compresses to help with your symptoms.  ? ?Please follow up with your primary doctor within the next 7-10 days for re-evaluation and further treatment of your symptoms.  ? ?Please return to the ER sooner if you have any new or worsening symptoms. ? ?

## 2021-08-07 NOTE — ED Provider Notes (Signed)
?[facility] EMERGENCY DEPARTMENT ?Provider Note ? ? ?CSN: [id number] ?Arrival date & time: 08/07/21  1020 ? ?  ? ?History ? ?Chief Complaint  ?Patient presents with  ? Fall  ? ? ?Rebecca Murillo is a 41 y.o. female. ? ?HPI ? ?41 year old female who presents emergency department today for evaluation of a fall that occurred 2 days ago.  States that it was slippery in her yard and she fell backwards onto her buttocks.  Since then she has had pain to the buttocks and lower back.  Denies any associated numbness weakness or incontinence.  Denies any head trauma or LOC.  Has been taking Tylenol without relief.  Pain is constant and worse with ambulation. ? ?Home Medications ?Prior to Admission medications   ?Medication Sig Start Date End Date Taking? Authorizing Provider  ?ibuprofen (ADVIL) 600 MG tablet Take 1 tablet (600 mg total) by mouth every 6 (six) hours as needed. 08/07/21  Yes Cayman Kielbasa S, PA-C  ?doxycycline (VIBRAMYCIN) 100 MG capsule Take 1 capsule (100 mg total) by mouth 2 (two) times daily. 01/08/18   Terrilee Files, MD  ?ibuprofen (ADVIL,MOTRIN) 800 MG tablet Take 1 tablet (800 mg total) by mouth every 8 (eight) hours as needed. 01/08/18   Terrilee Files, MD  ?metroNIDAZOLE (FLAGYL) 500 MG tablet Take 1 tablet (500 mg total) by mouth 2 (two) times daily. 09/20/16   Long, Arlyss Repress, MD  ?norgestimate-ethinyl estradiol (SPRINTEC 28) 0.25-35 MG-MCG tablet Take 1 tablet by mouth daily. 09/20/16   Long, Arlyss Repress, MD  ?ondansetron (ZOFRAN) 4 MG tablet Take 1 tablet (4 mg total) by mouth every 6 (six) hours. 11/25/20   Virgina Norfolk, DO  ?   ? ?Allergies    ?Nitrofurantoin monohyd macro   ? ?Review of Systems   ?Review of Systems ?See HPI for pertinent positives or negatives. ? ? ?Physical Exam ?Updated Vital Signs ?BP 116/69   Pulse 64   Temp 98.1 ?F (36.7 ?C) (Oral)   Resp 16   Ht 5\' 9"  (1.753 m)   Wt 96.6 kg   LMP 07/13/2021 Comment: U-preg Test  SpO2 100%   BMI 31.45 kg/m?  ?Physical  Exam ?Vitals and nursing note reviewed.  ?Constitutional:   ?   General: She is not in acute distress. ?   Appearance: She is well-developed.  ?HENT:  ?   Head: Normocephalic and atraumatic.  ?Eyes:  ?   Conjunctiva/sclera: Conjunctivae normal.  ?Cardiovascular:  ?   Rate and Rhythm: Normal rate.  ?Pulmonary:  ?   Effort: Pulmonary effort is normal.  ?Musculoskeletal:     ?   General: Normal range of motion.  ?   Cervical back: Neck supple.  ?   Comments: TTP to the lumbar spine and to the sacrum. 5/5 strength to the ble with normal sensation throughout  ?Skin: ?   General: Skin is warm and dry.  ?Neurological:  ?   Mental Status: She is alert.  ? ? ?ED Results / Procedures / Treatments   ?Labs ?(all labs ordered are listed, but only abnormal results are displayed) ?Labs Reviewed  ?URINALYSIS, ROUTINE W REFLEX MICROSCOPIC - Abnormal; Notable for the following components:  ?    Result Value  ? APPearance CLOUDY (*)   ? All other components within normal limits  ?PREGNANCY, URINE  ? ? ?EKG ?None ? ?Radiology ?DG Lumbar Spine Complete ? ?Result Date: 08/07/2021 ?CLINICAL DATA:  Fall on Friday EXAM: LUMBAR SPINE - COMPLETE 4 VIEW  COMPARISON:  10/08/2015 FINDINGS: There is no evidence of lumbar spine fracture. Alignment is normal. Focal advanced disc space narrowing and endplate spurring at L5-S1. Mild L4-5 disc space narrowing and lower lumbar facet spurring. IMPRESSION: No acute finding. Focally advanced L5-S1 disc degeneration that is similar to 2017. Electronically Signed   By: Tiburcio Pea M.D.   On: 08/07/2021 12:38  ? ?DG Sacrum/Coccyx ? ?Result Date: 08/07/2021 ?CLINICAL DATA:  Acute sacral/coccygeal pain following fall 2 days ago. Initial encounter. EXAM: SACRUM AND COCCYX - 2+ VIEW COMPARISON:  Prior CTs FINDINGS: There is no evidence of fracture or other focal bone lesions. IMPRESSION: Negative. Electronically Signed   By: Harmon Pier M.D.   On: 08/07/2021 12:43   ? ?Procedures ?Procedures  ? ? ?Medications  Ordered in ED ?Medications  ?ketorolac (TORADOL) 15 MG/ML injection 15 mg (15 mg Intramuscular Given 08/07/21 1233)  ? ? ?ED Course/ Medical Decision Making/ A&P ?  ?                        ?Medical Decision Making ?Amount and/or Complexity of Data Reviewed ?Labs: ordered. ?Radiology: ordered. ? ?Risk ?Prescription drug management. ? ? ?41 year old female presenting the emergency department today for evaluation of lower back and sacral pain that has been present since she had a mechanical fall 2 days ago.  Taking Tylenol at home without relief.  No red flag signs or symptoms, neurologic exam is benign.  Will obtain x-rays. ? ?Reviewed/interpreted imaging ?X-ray lumbar spine - neg, some ddd noted ?X-ray sacrum/coccyx - neg ? ?W/u reassuring. Likely contusion. Nsaids, ice, rest recommended. Pcp f/u and return precautions discussed. All questions answered, pt stable for discharge ? ?Final Clinical Impression(s) / ED Diagnoses ?Final diagnoses:  ?Fall, initial encounter  ?Acute low back pain, unspecified back pain laterality, unspecified whether sciatica present  ? ? ?Rx / DC Orders ?ED Discharge Orders   ? ?      Ordered  ?  ibuprofen (ADVIL) 600 MG tablet  Every 6 hours PRN       ? 08/07/21 1302  ? ?  ?  ? ?  ? ? ?  ?Karrie Meres, PA-C ?08/07/21 1303 ? ?  ?Sloan Leiter, DO ?08/08/21 1339 ? ?

## 2021-08-07 NOTE — ED Triage Notes (Signed)
Pt arrives pov, ambulatory to triage with c/o mechanical fall on Friday. Pt c/o lower back pain and buttock pain. No otc meds pta ?

## 2021-09-30 ENCOUNTER — Other Ambulatory Visit (HOSPITAL_BASED_OUTPATIENT_CLINIC_OR_DEPARTMENT_OTHER): Payer: Self-pay

## 2021-09-30 ENCOUNTER — Encounter (HOSPITAL_BASED_OUTPATIENT_CLINIC_OR_DEPARTMENT_OTHER): Payer: Self-pay

## 2021-09-30 ENCOUNTER — Emergency Department (HOSPITAL_BASED_OUTPATIENT_CLINIC_OR_DEPARTMENT_OTHER): Payer: Medicaid Other

## 2021-09-30 ENCOUNTER — Emergency Department (HOSPITAL_BASED_OUTPATIENT_CLINIC_OR_DEPARTMENT_OTHER)
Admission: EM | Admit: 2021-09-30 | Discharge: 2021-09-30 | Disposition: A | Discharge status: Home / Self Care | Payer: Medicaid Other | Attending: Emergency Medicine | Admitting: Emergency Medicine

## 2021-09-30 ENCOUNTER — Other Ambulatory Visit: Payer: Self-pay

## 2021-09-30 DIAGNOSIS — Z20822 Contact with and (suspected) exposure to covid-19: Secondary | ICD-10-CM | POA: Diagnosis not present

## 2021-09-30 DIAGNOSIS — R197 Diarrhea, unspecified: Secondary | ICD-10-CM

## 2021-09-30 DIAGNOSIS — F17210 Nicotine dependence, cigarettes, uncomplicated: Secondary | ICD-10-CM | POA: Diagnosis not present

## 2021-09-30 DIAGNOSIS — B349 Viral infection, unspecified: Secondary | ICD-10-CM | POA: Diagnosis not present

## 2021-09-30 HISTORY — DX: Acute pancreatitis without necrosis or infection, unspecified: K85.90

## 2021-09-30 LAB — URINALYSIS, MICROSCOPIC (REFLEX): RBC / HPF: >50 RBC/hpf (ref 0–5)

## 2021-09-30 LAB — URINALYSIS, ROUTINE W REFLEX MICROSCOPIC
Bilirubin Urine: NEGATIVE
Glucose, UA: NEGATIVE mg/dL
Ketones, ur: NEGATIVE mg/dL
Leukocytes,Ua: NEGATIVE
Nitrite: NEGATIVE
Protein, ur: NEGATIVE mg/dL
Specific Gravity, Urine: >=1.03 (ref 1.005–1.030)
pH: 5.5 (ref 5.0–8.0)

## 2021-09-30 LAB — RESP PANEL BY RT-PCR (FLU A&B, COVID) ARPGX2
Influenza A by PCR: NEGATIVE
Influenza B by PCR: NEGATIVE
SARS Coronavirus 2 by RT PCR: NEGATIVE

## 2021-09-30 LAB — PREGNANCY, URINE: Preg Test, Ur: NEGATIVE

## 2021-09-30 MED ORDER — KETOROLAC TROMETHAMINE 30 MG/ML IJ SOLN
30.0000 mg | Freq: Once | INTRAMUSCULAR | Status: AC
  Administered 2021-09-30: 30 mg via INTRAMUSCULAR
  Filled 2021-09-30: qty 1

## 2021-09-30 MED ORDER — ACETAMINOPHEN 325 MG PO TABS
650.0000 mg | ORAL_TABLET | Freq: Once | ORAL | Status: AC
  Administered 2021-09-30: 650 mg via ORAL
  Filled 2021-09-30: qty 2

## 2021-09-30 MED ORDER — SUCRALFATE 1 G PO TABS
1.0000 g | ORAL_TABLET | Freq: Three times a day (TID) | ORAL | 0 refills | Status: AC
  Filled 2021-09-30: qty 28, 7d supply, fill #0

## 2021-09-30 MED ORDER — DICYCLOMINE HCL 20 MG PO TABS
20.0000 mg | ORAL_TABLET | Freq: Two times a day (BID) | ORAL | 0 refills | Status: AC
  Filled 2021-09-30: qty 20, 10d supply, fill #0

## 2021-09-30 NOTE — ED Provider Notes (Signed)
?[facility] EMERGENCY DEPARTMENT ?Provider Note ? ? ?CSN: [id number] ?Arrival date & time: 09/30/21  2595 ? ?  ? ?History ? ?Chief Complaint  ?Patient presents with  ? Covid Exposure  ? ? ?Rebecca Murillo is a 41 y.o. female. ? ? Patient as above with significant medical history as below, including pancreatitis, pre-eclampsia, c/section who presents to the ED with complaint of ?covid 19 exposure. Diarrhea, abdominal cramping, poor appetite, arthralgias. Symptoms ongoing around 3-4 days. + sick contact at work. No medications for symptom control at home. She is tolerating PO intake. She has no fevers or chills, no emesis. She has cough. Non-prod. She is tolerating PO but has mildly reduced appetite. She is breathing comfortably, no chest pain. No rashes. ? ?Reports some diarrhea.  No recent travel.  No recent robotic use.  No suspicious p.o. intake.  She has irregular periods ? ? ? ? ?Past Medical History: ?No date: Heart palpitations ?No date: Pancreatitis ?No date: Pre-eclampsia in second trimester ? ?Past Surgical History: ?No date: CESAREAN SECTION  ? ? ?The history is provided by the patient. No language interpreter was used.  ? ?  ? ?Home Medications ?Prior to Admission medications   ?Medication Sig Start Date End Date Taking? Authorizing Provider  ?dicyclomine (BENTYL) 20 MG tablet Take 1 tablet (20 mg total) by mouth 2 (two) times daily. 09/30/21  Yes Tanda Rockers A, DO  ?sucralfate (CARAFATE) 1 g tablet Take 1 tablet (1 g total) by mouth 4 (four) times daily -  with meals and at bedtime for 7 days. 09/30/21 10/07/21 Yes Sloan Leiter, DO  ?doxycycline (VIBRAMYCIN) 100 MG capsule Take 1 capsule (100 mg total) by mouth 2 (two) times daily. 01/08/18   Terrilee Files, MD  ?ibuprofen (ADVIL) 600 MG tablet Take 1 tablet (600 mg total) by mouth every 6 (six) hours as needed. 08/07/21   Couture, Cortni S, PA-C  ?ibuprofen (ADVIL,MOTRIN) 800 MG tablet Take 1 tablet (800 mg total) by mouth every 8 (eight)  hours as needed. 01/08/18   Terrilee Files, MD  ?metroNIDAZOLE (FLAGYL) 500 MG tablet Take 1 tablet (500 mg total) by mouth 2 (two) times daily. 09/20/16   Long, Arlyss Repress, MD  ?norgestimate-ethinyl estradiol (SPRINTEC 28) 0.25-35 MG-MCG tablet Take 1 tablet by mouth daily. 09/20/16   Long, Arlyss Repress, MD  ?ondansetron (ZOFRAN) 4 MG tablet Take 1 tablet (4 mg total) by mouth every 6 (six) hours. 11/25/20   Virgina Norfolk, DO  ?   ? ?Allergies    ?Nitrofurantoin monohyd macro   ? ?Review of Systems   ?Review of Systems  ?Constitutional:  Negative for chills and fever.  ?HENT:  Negative for facial swelling and trouble swallowing.   ?Eyes:  Negative for photophobia and visual disturbance.  ?Respiratory:  Positive for cough. Negative for shortness of breath.   ?Cardiovascular:  Negative for chest pain and palpitations.  ?Gastrointestinal:  Positive for abdominal pain and diarrhea. Negative for nausea and vomiting.  ?Endocrine: Negative for polydipsia and polyuria.  ?Genitourinary:  Negative for difficulty urinating and hematuria.  ?Musculoskeletal:  Negative for gait problem and joint swelling.  ?Skin:  Negative for pallor and rash.  ?Neurological:  Negative for syncope and headaches.  ?Psychiatric/Behavioral:  Negative for agitation and confusion.   ? ?Physical Exam ?Updated Vital Signs ?BP 115/75 (BP Location: Right Arm)   Pulse 77   Temp 97.9 ?F (36.6 ?C) (Oral)   Resp 16   Ht 5\' 8"  (1.727 m)  Wt 90.7 kg   SpO2 100%   BMI 30.41 kg/m?  ?Physical Exam ?Vitals and nursing note reviewed.  ?Constitutional:   ?   General: She is not in acute distress. ?   Appearance: Normal appearance. She is obese. She is not ill-appearing or diaphoretic.  ?HENT:  ?   Head: Normocephalic and atraumatic.  ?   Right Ear: External ear normal.  ?   Left Ear: External ear normal.  ?   Nose: Nose normal.  ?   Mouth/Throat:  ?   Mouth: Mucous membranes are moist.  ?Eyes:  ?   General: No scleral icterus.    ?   Right eye: No discharge.     ?    Left eye: No discharge.  ?Cardiovascular:  ?   Rate and Rhythm: Normal rate and regular rhythm.  ?   Pulses: Normal pulses.  ?   Heart sounds: Normal heart sounds.  ?Pulmonary:  ?   Effort: Pulmonary effort is normal. No respiratory distress.  ?   Breath sounds: Normal breath sounds.  ?Abdominal:  ?   General: Abdomen is flat.  ?   Palpations: Abdomen is soft.  ?   Tenderness: There is no abdominal tenderness. There is no guarding or rebound.  ?Musculoskeletal:     ?   General: Normal range of motion.  ?   Cervical back: Normal range of motion.  ?   Right lower leg: No edema.  ?   Left lower leg: No edema.  ?Skin: ?   General: Skin is warm and dry.  ?   Capillary Refill: Capillary refill takes less than 2 seconds.  ?Neurological:  ?   Mental Status: She is alert and oriented to person, place, and time.  ?   GCS: GCS eye subscore is 4. GCS verbal subscore is 5. GCS motor subscore is 6.  ?Psychiatric:     ?   Mood and Affect: Mood normal.     ?   Behavior: Behavior normal.  ? ? ?ED Results / Procedures / Treatments   ?Labs ?(all labs ordered are listed, but only abnormal results are displayed) ?Labs Reviewed  ?URINALYSIS, ROUTINE W REFLEX MICROSCOPIC - Abnormal; Notable for the following components:  ?    Result Value  ? Hgb urine dipstick MODERATE (*)   ? All other components within normal limits  ?URINALYSIS, MICROSCOPIC (REFLEX) - Abnormal; Notable for the following components:  ? Bacteria, UA MANY (*)   ? All other components within normal limits  ?RESP PANEL BY RT-PCR (FLU A&B, COVID) [id number]  ?URINE CULTURE  ?PREGNANCY, URINE  ? ? ?EKG ?None ? ?Radiology ?DG Chest 1 View ? ?Result Date: 09/30/2021 ?CLINICAL DATA:  [age]-year-old female with history of COVID exposure. Fatigue. Abdominal pain, diarrhea, nausea and chills. EXAM: CHEST  1 VIEW COMPARISON:  Chest x-ray 01/08/2018. FINDINGS: Lung volumes are normal. No consolidative airspace disease. No pleural effusions. No pneumothorax. No pulmonary nodule or mass  noted. Pulmonary vasculature and the cardiomediastinal silhouette are within normal limits. IMPRESSION: No radiographic evidence of acute cardiopulmonary disease. Electronically Signed   By: Trudie Reedaniel  Entrikin M.D.   On: 09/30/2021 09:27   ? ?Procedures ?Procedures  ? ? ?Medications Ordered in ED ?Medications  ?ketorolac (TORADOL) 30 MG/ML injection 30 mg (30 mg Intramuscular Given 09/30/21 1039)  ?acetaminophen (TYLENOL) tablet 650 mg (650 mg Oral Given 09/30/21 1037)  ? ? ?ED Course/ Medical Decision Making/ A&P ?  ?                        ?  Medical Decision Making ?Amount and/or Complexity of Data Reviewed ?Labs: ordered. ?Radiology: ordered. ? ?Risk ?OTC drugs. ?Prescription drug management. ? ? ? ?CC: Diarrhea, cough, arthralgias ? ?This patient presents to the Emergency Department for the above complaint. This involves an extensive number of treatment options and is a complaint that carries with it a high risk of complications and morbidity. Vital signs were reviewed. Serious etiologies considered. ? ?DDx includes but not limited to viral syndrome, COVID-19, flu, enteritis, gastritis, cystitis, pyelonephritis, foodborne illness, other ? ?Record review:  ?Previous records obtained and reviewed  ?Prior ED visits, prior labs.  Prior imaging ? ?Additional history obtained from friend at bedside ? ?Medical and surgical history as noted above.  ? ?Work up as above, notable for:  ? ?Labs & imaging results that were available during my care of the patient were visualized by me and considered in my medical decision making. ?  ?I ordered imaging studies which included chest x-ray and I visualized the imaging and I agree with radiologist interpretation.  No acute process ? ?Urinalysis does not demonstrate infection, there is bacteria present but no WBCs.  No nitrite or leukocyte esterase.  Given there is many bacteria will send for culture.  She does have some blood in her urine.  She has intermittent periods.  Think she might  be starting her menstrual cycle. ? ?Cardiac monitoring reviewed and interpreted personally which shows NSR ? ? ?Management: ?Given Toradol, acetaminophen ? ?Reassessment:  ?Patient reports she is feeling much

## 2021-09-30 NOTE — ED Notes (Signed)
Lab notified of order urine culture ?

## 2021-09-30 NOTE — Discharge Instructions (Addendum)
It was a pleasure caring for you today in the emergency department. ? ?Please return to the emergency department for any worsening or worrisome symptoms. ? ?Your covid/flu test was negative today ? ?

## 2021-09-30 NOTE — ED Triage Notes (Addendum)
Covid exposure last Tuesday. Symptoms started Saturday Fatigue, Abdominal pain, diarrhea, nausea chills ?

## 2021-10-01 LAB — URINE CULTURE

## 2021-10-07 ENCOUNTER — Other Ambulatory Visit (HOSPITAL_BASED_OUTPATIENT_CLINIC_OR_DEPARTMENT_OTHER): Payer: Self-pay

## 2022-03-10 ENCOUNTER — Encounter (HOSPITAL_BASED_OUTPATIENT_CLINIC_OR_DEPARTMENT_OTHER): Payer: Self-pay

## 2022-03-10 ENCOUNTER — Emergency Department (HOSPITAL_BASED_OUTPATIENT_CLINIC_OR_DEPARTMENT_OTHER)
Admission: EM | Admit: 2022-03-10 | Discharge: 2022-03-10 | Disposition: A | Discharge status: Home / Self Care | Payer: Medicaid Other | Attending: Emergency Medicine | Admitting: Emergency Medicine

## 2022-03-10 ENCOUNTER — Other Ambulatory Visit: Payer: Self-pay

## 2022-03-10 DIAGNOSIS — R748 Abnormal levels of other serum enzymes: Secondary | ICD-10-CM | POA: Diagnosis not present

## 2022-03-10 DIAGNOSIS — M62838 Other muscle spasm: Secondary | ICD-10-CM

## 2022-03-10 LAB — URINALYSIS, ROUTINE W REFLEX MICROSCOPIC
Bilirubin Urine: NEGATIVE
Glucose, UA: NEGATIVE mg/dL
Hgb urine dipstick: NEGATIVE
Ketones, ur: 15 mg/dL — AB
Nitrite: NEGATIVE
Protein, ur: NEGATIVE mg/dL
Specific Gravity, Urine: 1.015 (ref 1.005–1.030)
pH: 5 (ref 5.0–8.0)

## 2022-03-10 LAB — BASIC METABOLIC PANEL
Anion gap: 7 (ref 5–15)
BUN: 9 mg/dL (ref 6–20)
CO2: 19 mmol/L — ABNORMAL LOW (ref 22–32)
Calcium: 8.7 mg/dL — ABNORMAL LOW (ref 8.9–10.3)
Chloride: 109 mmol/L (ref 98–111)
Creatinine, Ser: 0.81 mg/dL (ref 0.44–1.00)
GFR, Estimated: >60 mL/min (ref >60)
Glucose, Bld: 93 mg/dL (ref 70–99)
Potassium: 3.7 mmol/L (ref 3.5–5.1)
Sodium: 135 mmol/L (ref 135–145)

## 2022-03-10 LAB — CBC WITH DIFFERENTIAL/PLATELET
Abs Immature Granulocytes: 0.03 10*3/uL (ref 0.00–0.07)
Basophils Absolute: 0 10*3/uL (ref 0.0–0.1)
Basophils Relative: 0 %
Eosinophils Absolute: 0.1 10*3/uL (ref 0.0–0.5)
Eosinophils Relative: 1 %
HCT: 40.1 % (ref 36.0–46.0)
Hemoglobin: 13.9 g/dL (ref 12.0–15.0)
Immature Granulocytes: 0 %
Lymphocytes Relative: 19 %
Lymphs Abs: 1.7 10*3/uL (ref 0.7–4.0)
MCH: 30.5 pg (ref 26.0–34.0)
MCHC: 34.7 g/dL (ref 30.0–36.0)
MCV: 87.9 fL (ref 80.0–100.0)
Monocytes Absolute: 1.1 10*3/uL — ABNORMAL HIGH (ref 0.1–1.0)
Monocytes Relative: 12 %
Neutro Abs: 5.9 10*3/uL (ref 1.7–7.7)
Neutrophils Relative %: 68 %
Platelets: 247 10*3/uL (ref 150–400)
RBC: 4.56 MIL/uL (ref 3.87–5.11)
RDW: 14.9 % (ref 11.5–15.5)
WBC: 8.7 10*3/uL (ref 4.0–10.5)
nRBC: 0 % (ref 0.0–0.2)

## 2022-03-10 LAB — URINALYSIS, MICROSCOPIC (REFLEX)

## 2022-03-10 LAB — MAGNESIUM: Magnesium: 2.1 mg/dL (ref 1.7–2.4)

## 2022-03-10 LAB — PREGNANCY, URINE: Preg Test, Ur: NEGATIVE

## 2022-03-10 LAB — CK: Total CK: 517 U/L — ABNORMAL HIGH (ref 38–234)

## 2022-03-10 MED ORDER — HYDROXYZINE HCL 25 MG PO TABS
25.0000 mg | ORAL_TABLET | Freq: Once | ORAL | Status: AC
  Administered 2022-03-10: 25 mg via ORAL
  Filled 2022-03-10: qty 1

## 2022-03-10 MED ORDER — SODIUM CHLORIDE 0.9 % IV BOLUS
1000.0000 mL | Freq: Once | INTRAVENOUS | Status: AC
  Administered 2022-03-10: 1000 mL via INTRAVENOUS

## 2022-03-10 MED ORDER — METHOCARBAMOL 500 MG PO TABS: 500.0000 mg | ORAL_TABLET | Freq: Two times a day (BID) | ORAL | 0 refills | Status: AC

## 2022-03-10 MED ORDER — HYDROXYZINE HCL 25 MG PO TABS: 25.0000 mg | ORAL_TABLET | Freq: Four times a day (QID) | ORAL | 0 refills | Status: AC

## 2022-03-10 MED ORDER — METHOCARBAMOL 500 MG PO TABS
500.0000 mg | ORAL_TABLET | Freq: Once | ORAL | Status: AC
  Administered 2022-03-10: 500 mg via ORAL
  Filled 2022-03-10: qty 1

## 2022-03-10 NOTE — ED Notes (Addendum)
States that she has muscle spasm for the entire week.States she has pin and needle in hand and feet
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
## 2022-03-10 NOTE — ED Provider Notes (Signed)
Awaiting completion of fluids. Physical Exam  BP 115/72   Pulse 80   Temp 98.5 F (36.9 C) (Oral)   Resp 20   Ht 5\' 8"  (1.727 m)   Wt 90.7 kg   SpO2 100%   BMI 30.41 kg/m   Physical Exam Vitals and nursing note reviewed.  Constitutional:      General: She is not in acute distress.    Appearance: She is well-developed.  HENT:     Head: Normocephalic and atraumatic.  Eyes:     General:        Right eye: No discharge.        Left eye: No discharge.     Conjunctiva/sclera: Conjunctivae normal.  Pulmonary:     Effort: No respiratory distress.  Neurological:     Mental Status: She is alert.     Comments: Clear speech.   Psychiatric:        Behavior: Behavior normal.        Thought Content: Thought content normal.     Procedures  Procedures  ED Course / MDM    Medical Decision Making Amount and/or Complexity of Data Reviewed Labs: ordered.  Risk Prescription drug management.   Feeling better. Counseled by previous provider. Feels safe at home. Discharged with return precautions.       Osvaldo Shipper, [place] 03/10/22 0932    Sherwood Gambler, MD 03/11/22 [phone]

## 2022-03-10 NOTE — ED Triage Notes (Signed)
Patient reports under a lot of stress and having severe cramps in her neck and back and legs.  Reports work has been really stressful.  Unsure if it is related to low K or the stress.  Reports she has to eat mustard to get it to stop.  Patient is very tearful and unsure what is going on.  Denies heavy lifting or injury.
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
## 2022-03-10 NOTE — Discharge Instructions (Addendum)
Your labs today did show a elevated CK level does show some slight muscle breakdown which is likely from her muscle cramps.  I written you for a few medications to help.  Sure to drink plenty of fluids  Follow-up with primary care provider for recheck of your labs next week  Return for new or worsening symptoms.
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
## 2024-04-07 IMAGING — DX DG CHEST 1V
1 series · 1 of 1 positions shown · non-contrast
Comparison: Chest x-ray 01/08/2018.

CLINICAL DATA: [DATE]-year-old female with history of COVID
exposure. Fatigue. Abdominal pain, diarrhea, nausea and chills.

EXAM:
CHEST  1 VIEW

[chest ap]
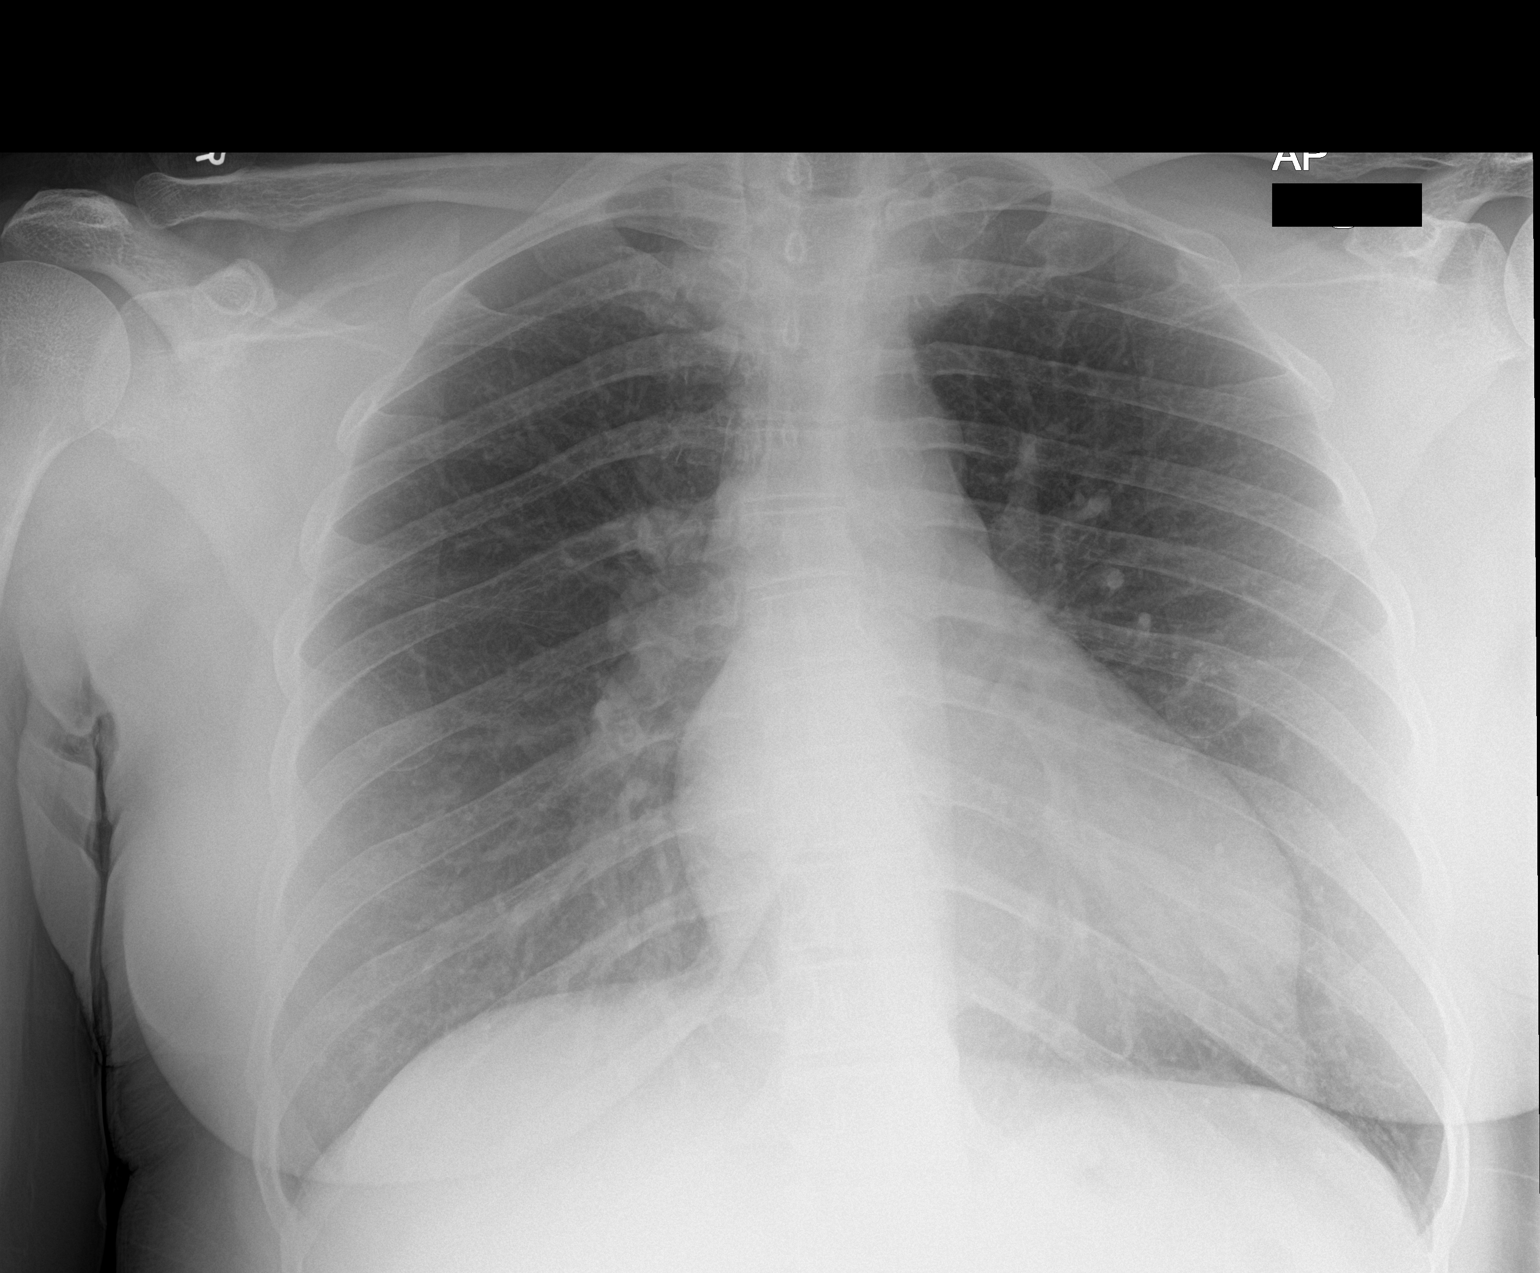

[1 of 1 positions shown; findings below may reference images not displayed]

FINDINGS: Lung volumes are normal. No consolidative airspace disease. No
pleural effusions. No pneumothorax. No pulmonary nodule or mass
noted. Pulmonary vasculature and the cardiomediastinal silhouette
are within normal limits.
IMPRESSION: No radiographic evidence of acute cardiopulmonary disease.
